# Patient Record
Sex: Female | Born: 1980
Health system: Southern US, Community
[De-identification: ages and names within clinical notes are randomized; demographics above are authoritative.]

## PROBLEM LIST (undated history)

## (undated) DIAGNOSIS — D649 Anemia, unspecified: Secondary | ICD-10-CM

## (undated) DIAGNOSIS — G35 Multiple sclerosis: Secondary | ICD-10-CM

## (undated) DIAGNOSIS — I10 Essential (primary) hypertension: Secondary | ICD-10-CM

---

## 2019-05-08 ENCOUNTER — Emergency Department (HOSPITAL_COMMUNITY)
Admission: EM | Admit: 2019-05-08 | Discharge: 2019-05-08 | Disposition: A | Discharge status: Home / Self Care | Payer: Self-pay | Attending: Emergency Medicine | Admitting: Emergency Medicine

## 2019-05-08 ENCOUNTER — Other Ambulatory Visit: Payer: Self-pay

## 2019-05-08 ENCOUNTER — Encounter (HOSPITAL_COMMUNITY): Payer: Self-pay | Admitting: Emergency Medicine

## 2019-05-08 DIAGNOSIS — Z202 Contact with and (suspected) exposure to infections with a predominantly sexual mode of transmission: Secondary | ICD-10-CM | POA: Insufficient documentation

## 2019-05-08 DIAGNOSIS — I10 Essential (primary) hypertension: Secondary | ICD-10-CM | POA: Insufficient documentation

## 2019-05-08 DIAGNOSIS — F17210 Nicotine dependence, cigarettes, uncomplicated: Secondary | ICD-10-CM | POA: Insufficient documentation

## 2019-05-08 HISTORY — DX: Essential (primary) hypertension: I10

## 2019-05-08 LAB — I-STAT BETA HCG BLOOD, ED (MC, WL, AP ONLY): I-stat hCG, quantitative: <5 m[IU]/mL (ref <5)

## 2019-05-08 MED ORDER — LIDOCAINE HCL (PF) 1 % IJ SOLN
INTRAMUSCULAR | Status: AC
  Administered 2019-05-08: 21:00:00 2 mL
  Filled 2019-05-08: qty 5

## 2019-05-08 MED ORDER — AZITHROMYCIN 1 G PO PACK
1.0000 g | PACK | Freq: Once | ORAL | Status: AC
  Administered 2019-05-08: 21:00:00 1 g via ORAL
  Filled 2019-05-08: qty 1

## 2019-05-08 MED ORDER — CEFTRIAXONE SODIUM 250 MG IJ SOLR
250.0000 mg | Freq: Once | INTRAMUSCULAR | Status: AC
  Administered 2019-05-08: 250 mg via INTRAMUSCULAR
  Filled 2019-05-08: qty 250

## 2019-05-08 MED ORDER — HYDROCHLOROTHIAZIDE 12.5 MG PO TABS: 12.5000 mg | ORAL_TABLET | Freq: Every day | ORAL | 0 refills | Status: DC

## 2019-05-08 NOTE — ED Notes (Signed)
Updated on wait for treatment room. 

## 2019-05-08 NOTE — ED Provider Notes (Addendum)
[facility] EMERGENCY DEPARTMENT Provider Note   CSN: [id number] Arrival date & time: 05/08/19  1648    History   Chief Complaint Chief Complaint  Patient presents with  . SEXUALLY TRANSMITTED DISEASE    HPI Diana Hess is a 38 y.o. female.     HPI  38 year old female presents with concern for STI testing.  She states that a female who recently had intercourse with her boyfriend told her that she tested positive for chlamydia.  The patient now wants to be tested though she has no symptoms.  No dysuria, abdominal pain, discharge, etc. Wants a pregnancy test.  Past Medical History:  Diagnosis Date  . Hypertension     There are no active problems to display for this patient.   Past Surgical History:  Procedure Laterality Date  . CESAREAN SECTION       OB History   No obstetric history on file.      Home Medications    Prior to Admission medications   Medication Sig Start Date End Date Taking? Authorizing Provider  hydrochlorothiazide (HYDRODIURIL) 12.5 MG tablet Take 1 tablet (12.5 mg total) by mouth daily. 05/08/19   Pricilla Loveless, MD    Family History No family history on file.  Social History Social History   Tobacco Use  . Smoking status: Current Every Day Smoker  . Smokeless tobacco: Never Used  Substance Use Topics  . Alcohol use: Yes  . Drug use: Never     Allergies   Patient has no allergy information on record.   Review of Systems Review of Systems  Gastrointestinal: Negative for abdominal pain.  Genitourinary: Negative for dysuria, vaginal bleeding and vaginal discharge.  All other systems reviewed and are negative.    Physical Exam Updated Vital Signs BP (!) 145/88 (BP Location: Right Arm)   Pulse 94   Temp 99.1 F (37.3 C) (Oral)   Resp 18   Ht 5\' 7"  (1.702 m)   Wt 108.9 kg   LMP 04/21/2019 (Approximate)   SpO2 100%   BMI 37.59 kg/m   Physical Exam Vitals signs and nursing note reviewed.   Constitutional:      Appearance: She is well-developed.  HENT:     Head: Normocephalic and atraumatic.     Right Ear: External ear normal.     Left Ear: External ear normal.     Nose: Nose normal.  Eyes:     General:        Right eye: No discharge.        Left eye: No discharge.  Cardiovascular:     Rate and Rhythm: Normal rate and regular rhythm.     Heart sounds: Normal heart sounds.  Pulmonary:     Effort: Pulmonary effort is normal.     Breath sounds: Normal breath sounds.  Abdominal:     General: There is no distension.     Palpations: Abdomen is soft.     Tenderness: There is no abdominal tenderness.  Skin:    General: Skin is warm and dry.  Neurological:     Mental Status: She is alert.  Psychiatric:        Mood and Affect: Mood is not anxious.      ED Treatments / Results  Labs (all labs ordered are listed, but only abnormal results are displayed) Labs Reviewed  RPR  HIV ANTIBODY (ROUTINE TESTING W REFLEX)  I-STAT BETA HCG BLOOD, ED ([facility], [facility], [facility] ONLY)  GC/CHLAMYDIA PROBE AMP ([facility]) NOT AT [facility]    EKG None  Radiology No results found.  Procedures Procedures (including critical care time)  Medications Ordered in ED Medications  cefTRIAXone (ROCEPHIN) injection 250 mg (has no administration in time range)  azithromycin (ZITHROMAX) powder 1 g (has no administration in time range)     Initial Impression / Assessment and Plan / ED Course  I have reviewed the triage vital signs and the nursing notes.  Pertinent labs & imaging results that were available during my care of the patient were reviewed by me and considered in my medical decision making (see chart for details).        Patient essentially wants screening for STI.  Given she is not symptomatic I will have her do urine GC/chlamydia and we will get HIV and RPR.  She is not pregnant.  I do not see an indication for a pelvic exam at this time.  She is wanting prophylactic treatment given  delay in test results, so I will give her IM Rocephin and p.o. azithromycin.  Patient also asked for blood pressure management.  She states her blood pressure has been high for a long time and recently she moved to this area.  She does not know what it normally runs but would like to be started on medicine.  I discussed the importance of following up with her PCP and will start her on low-dose HCTZ.  We discussed return precautions.  Final Clinical Impressions(s) / ED Diagnoses   Final diagnoses:  STD exposure  Essential hypertension    ED Discharge Orders         Ordered    hydrochlorothiazide (HYDRODIURIL) 12.5 MG tablet  Daily     05/08/19 2109           Sherwood Gambler, MD 05/08/19 2100    Sherwood Gambler, MD 05/08/19 2110

## 2019-05-08 NOTE — ED Triage Notes (Addendum)
Pt st's she needs to be checked for STD's  Pt denies any symptoms  Pt also requesting preg test

## 2019-05-09 LAB — HIV ANTIBODY (ROUTINE TESTING W REFLEX): HIV Screen 4th Generation wRfx: NONREACTIVE

## 2019-05-09 LAB — RPR: RPR Ser Ql: NONREACTIVE

## 2019-08-31 ENCOUNTER — Ambulatory Visit (HOSPITAL_COMMUNITY)
Admission: EM | Admit: 2019-08-31 | Discharge: 2019-08-31 | Disposition: A | Discharge status: Home / Self Care | Payer: Self-pay | Attending: Family Medicine | Admitting: Family Medicine

## 2019-08-31 ENCOUNTER — Encounter (HOSPITAL_COMMUNITY): Payer: Self-pay

## 2019-08-31 ENCOUNTER — Other Ambulatory Visit: Payer: Self-pay

## 2019-08-31 DIAGNOSIS — G35 Multiple sclerosis: Secondary | ICD-10-CM

## 2019-08-31 DIAGNOSIS — I10 Essential (primary) hypertension: Secondary | ICD-10-CM

## 2019-08-31 HISTORY — DX: Multiple sclerosis: G35

## 2019-08-31 MED ORDER — AMLODIPINE BESYLATE 10 MG PO TABS: 10.0000 mg | ORAL_TABLET | Freq: Every day | ORAL | 2 refills | Status: DC

## 2019-08-31 MED ORDER — METHYLPREDNISOLONE SODIUM SUCC 125 MG IJ SOLR
INTRAMUSCULAR | Status: AC
  Filled 2019-08-31: qty 2

## 2019-08-31 MED ORDER — METHYLPREDNISOLONE SODIUM SUCC 125 MG IJ SOLR
125.0000 mg | Freq: Once | INTRAMUSCULAR | Status: AC
  Administered 2019-08-31: 125 mg via INTRAMUSCULAR

## 2019-08-31 MED ORDER — ATENOLOL 25 MG PO TABS
25.0000 mg | ORAL_TABLET | Freq: Once | ORAL | Status: AC
  Administered 2019-08-31: 25 mg via ORAL

## 2019-08-31 MED ORDER — ATENOLOL 25 MG PO TABS
ORAL_TABLET | ORAL | Status: AC
  Filled 2019-08-31: qty 1

## 2019-08-31 MED ORDER — PREDNISONE 10 MG (48) PO TBPK: ORAL_TABLET | ORAL | 0 refills | Status: DC

## 2019-08-31 NOTE — ED Triage Notes (Signed)
Pt presents with bilateral hand numbness that has been going on for 3 weeks. Patient feels like her extremities are heavy. Reports she has MS and has had these issues in the past. Reports last time it was much worse so she wanted to come in before it got that bad. No neuro deficits present during triage.

## 2019-09-01 NOTE — ED Provider Notes (Signed)
[facility]   [id number] 08/31/19 Arrival Time: 8413  ASSESSMENT & PLAN:  1. Multiple sclerosis ([place])   2. Uncontrolled hypertension     Normal neurologic exam. No suspicion for ICH or SAH. No indication for neurodiagnostic imaging at this time. Discussed.  Meds ordered this encounter  Medications  . amLODipine (NORVASC) 10 MG tablet    Sig: Take 1 tablet (10 mg total) by mouth daily.    Dispense:  30 tablet    Refill:  2  . predniSONE (STERAPRED UNI-PAK 48 TAB) 10 MG (48) TBPK tablet    Sig: Take as directed.    Dispense:  48 tablet    Refill:  0      Given here:  . methylPREDNISolone sodium succinate (SOLU-MEDROL) 125 mg/2 mL injection 125 mg  . atenolol (TENORMIN) tablet 25 mg   "Just give me something to get my blood pressure down a little". Does not desire to stay for BP recheck. No s/s of hypertensive urgency.  Reassured that these symptoms do not appear to represent a serious or threatening condition. This is generally a self-limited temporary but uncomfortable situation. Rest, avoid potentially dangerous activities (such as driving or working with machinery or at heights). Use OTC Meclizine prn. Will proceed to the ED if he develops other symptoms such as alterations of speech, swallowing, vision, motor/sensory systems, or if dizziness worsens.  Reviewed expectations re: course of current medical issues. Questions answered. Outlined signs and symptoms indicating need for more acute intervention. Patient verbalized understanding. After Visit Summary given.   SUBJECTIVE:  Diana Hess is a 38 y.o. female with MS who reports gradual onset of bilateral sensation of "my hands feeling numb". First noted approx 3 weeks ago; waxes/wanes. Reports MS flares in the past with similar symptoms. "Just want to catch it before it gets worse." No dizziness/vertigo/lightheadedness reported. Experiencing symptoms frequently/daily with episodes varying in duration.  Aggravating factors: none identified. Alleviating factors: none identified. Denies coordination problems, gait problems, headaches, memory problems, seizures, speech problems, tremors and weakness as well as otalgia and tinnitus. Recent infections: none. Head trauma: denied. Noise exposure: none. No associated SOB, CP, or palpatations reported. Recent travel: none. Reports normal bowel/bladder habits. Therapies tried thus far: none. No new medications. Ambulatory without difficulty.  Social History   Substance and Sexual Activity  Alcohol Use Yes   Social History   Tobacco Use  Smoking Status Current Every Day Smoker  Smokeless Tobacco Never Used   Denies illegal drug use.   Increased blood pressure noted today. Reports that she has been treated for hypertension in the past. "Have run out of my medicine"; she thinks this is the HCTZ. "But I don't think it was working and it made me pee a lot; I don't want to take it anymore." Does desire to be placed on a different BP medication.  She reports no chest pain on exertion, no dyspnea on exertion, no swelling of ankles, no orthopnea or paroxysmal nocturnal dyspnea, no palpitations and no intermittent claudication symptoms.  ROS: As per HPI. All other systems negative.    OBJECTIVE:  Vitals:   08/31/19 1701 08/31/19 1729  BP: (!) 212/101 (!) 182/118  Pulse: 87   Resp: 18   Temp: 98.7 F (37.1 C)   SpO2: 98%     Elevated blood pressures noted.  General appearance: alert; no distress Eyes: PERRLA; EOMI; conjunctiva normal HENT: normocephalic; atraumatic; TMs normal; nasal mucosa normal; oral mucosa normal Neck: supple with FROM Lungs: clear to  auscultation bilaterally Heart: regular rate and rhythm Abdomen: soft, non-tender; bowel sounds normal Extremities: moves all extremities normally; no edema; symmetrical with no gross deformities Skin: warm and dry Neurologic: normal gait; DTR's normal and symmetric; normal grip strength  bilaterally Psychological: alert and cooperative; normal mood and affect   No Known Allergies  Past Medical History:  Diagnosis Date  . Hypertension   . Multiple sclerosis (H[facility])    Social History   Socioeconomic History  . Marital status: Single    Spouse name: Not on file  . Number of children: Not on file  . Years of education: Not on file  . Highest education level: Not on file  Occupational History  . Not on file  Tobacco Use  . Smoking status: Current Every Day Smoker  . Smokeless tobacco: Never Used  Substance and Sexual Activity  . Alcohol use: Yes  . Drug use: Never  . Sexual activity: Not on file  Other Topics Concern  . Not on file  Social History Narrative  . Not on file   Social Determinants of Health   Financial Resource Strain:   . Difficulty of Paying Living Expenses: Not on file  Food Insecurity:   . Worried About Programme researcher, broadcasting/film/video in the Last Year: Not on file  . Ran Out of Food in the Last Year: Not on file  Transportation Needs:   . Lack of Transportation (Medical): Not on file  . Lack of Transportation (Non-Medical): Not on file  Physical Activity:   . Days of Exercise per Week: Not on file  . Minutes of Exercise per Session: Not on file  Stress:   . Feeling of Stress : Not on file  Social Connections:   . Frequency of Communication with Friends and Family: Not on file  . Frequency of Social Gatherings with Friends and Family: Not on file  . Attends Religious Services: Not on file  . Active Member of Clubs or Organizations: Not on file  . Attends Banker Meetings: Not on file  . Marital Status: Not on file  Intimate Partner Violence:   . Fear of Current or Ex-Partner: Not on file  . Emotionally Abused: Not on file  . Physically Abused: Not on file  . Sexually Abused: Not on file   Family History  Problem Relation Age of Onset  . Healthy Mother   . Healthy Father   Question of HTN.  Past Surgical History:  Procedure  Laterality Date  . CESAREAN SECTION        Mardella Layman, MD 09/01/19 [phone]

## 2020-02-10 ENCOUNTER — Ambulatory Visit
Admission: EM | Admit: 2020-02-10 | Discharge: 2020-02-10 | Disposition: A | Discharge status: Home / Self Care | Payer: Self-pay | Attending: Emergency Medicine | Admitting: Emergency Medicine

## 2020-02-10 DIAGNOSIS — I10 Essential (primary) hypertension: Secondary | ICD-10-CM

## 2020-02-10 DIAGNOSIS — Z760 Encounter for issue of repeat prescription: Secondary | ICD-10-CM

## 2020-02-10 MED ORDER — AMLODIPINE BESYLATE 10 MG PO TABS: 10.0000 mg | ORAL_TABLET | Freq: Every day | ORAL | 1 refills | Status: DC

## 2020-02-10 NOTE — ED Triage Notes (Signed)
Pt states moved here recently and can't get in to a new PCP for another 2 months. Pt states out of her b/p meds for 3wks and having headaches off and on for the past 2wks. No distress noted.

## 2020-02-10 NOTE — Discharge Instructions (Addendum)
Very important to take blood pressure medication at the same time every day. You have been given 1 month supply with 1 refill. To receive further refills/medication, you will need to see your primary care doctor. Please keep July appointment.

## 2020-02-10 NOTE — ED Provider Notes (Signed)
[facility]    CSN: [id number] Arrival date & time: 02/10/20  1616      History   Chief Complaint Chief Complaint  Patient presents with  . Hypertension    HPI Diana Hess is a 39 y.o. female with history of hypertension, MS presenting for medication refill.  States that she moved here recently and was unable to get a primary care appointment for medication refills until July.  States she has tolerated amlodipine 10 mg well in the past: Has been off of her meds for 3 weeks.  Has noted intermittent headaches for the last 2 weeks.  Denies change in vision, hearing, dizziness, weakness, chest pain, difficulty breathing, abdominal pain.    Past Medical History:  Diagnosis Date  . Hypertension   . Multiple sclerosis ([place])     There are no problems to display for this patient.   Past Surgical History:  Procedure Laterality Date  . CESAREAN SECTION      OB History   No obstetric history on file.      Home Medications    Prior to Admission medications   Medication Sig Start Date End Date Taking? Authorizing Provider  amLODipine (NORVASC) 10 MG tablet Take 1 tablet (10 mg total) by mouth daily. 02/10/20   Hall-Potvin, [place], PA-C    Family History Family History  Problem Relation Age of Onset  . Healthy Mother   . Healthy Father     Social History Social History   Tobacco Use  . Smoking status: Current Every Day Smoker  . Smokeless tobacco: Never Used  Substance Use Topics  . Alcohol use: Yes  . Drug use: Never     Allergies   Patient has no known allergies.   Review of Systems As per HPI   Physical Exam Triage Vital Signs ED Triage Vitals  Enc Vitals Group     BP      Pulse      Resp      Temp      Temp src      SpO2      Weight      Height      Head Circumference      Peak Flow      Pain Score      Pain Loc      Pain Edu?      Excl. in [place]?    No data found.  Updated Vital Signs BP (!) 148/94 (BP Location:  Left Arm)   Pulse 97   Temp 98.6 F (37 C) (Oral)   Resp 16   LMP 01/04/2020   SpO2 97%   Visual Acuity Right Eye Distance:   Left Eye Distance:   Bilateral Distance:    Right Eye Near:   Left Eye Near:    Bilateral Near:     Physical Exam Constitutional:      General: She is not in acute distress. HENT:     Head: Normocephalic and atraumatic.  Eyes:     General: No scleral icterus.    Pupils: Pupils are equal, round, and reactive to light.  Cardiovascular:     Rate and Rhythm: Normal rate.  Pulmonary:     Effort: Pulmonary effort is normal.  Skin:    Coloration: Skin is not jaundiced or pale.  Neurological:     Mental Status: She is alert and oriented to person, place, and time.      UC Treatments / Results  Labs (all labs  ordered are listed, but only abnormal results are displayed) Labs Reviewed - No data to display  EKG   Radiology No results found.  Procedures Procedures (including critical care time)  Medications Ordered in UC Medications - No data to display  Initial Impression / Assessment and Plan / UC Course  I have reviewed the triage vital signs and the nursing notes.  Pertinent labs & imaging results that were available during my care of the patient were reviewed by me and considered in my medical decision making (see chart for details).     Patient afebrile, nontoxic in office today.  Patient's blood pressure is elevated, though she is denying signs/symptoms of endorgan damage.  Chart review does not show that patient has PCP appointment in July: This provider coordinated care-patient has PCP appointment scheduled for 7/8.  Relayed appointment date/time to patient who verbalized understanding: Intends to keep.  Will send in 30-day supply of amlodipine, 1 refill so patient has enough medication to get her through to appointment.  Return precautions discussed, patient verbalized understanding and is agreeable to plan. Final Clinical  Impressions(s) / UC Diagnoses   Final diagnoses:  Essential hypertension  Encounter for medication refill     Discharge Instructions     Very important to take blood pressure medication at the same time every day. You have been given 1 month supply with 1 refill. To receive further refills/medication, you will need to see your primary care doctor. Please keep July appointment.     ED Prescriptions    Medication Sig Dispense Auth. Provider   amLODipine (NORVASC) 10 MG tablet Take 1 tablet (10 mg total) by mouth daily. 30 tablet Hall-Potvin, [place], PA-C     PDMP not reviewed this encounter.   Hall-Potvin, [place], [place] 02/11/20 [phone]

## 2020-02-11 ENCOUNTER — Encounter: Payer: Self-pay | Admitting: Emergency Medicine

## 2020-03-28 ENCOUNTER — Telehealth: Payer: Self-pay

## 2020-03-28 NOTE — Telephone Encounter (Signed)
Called patient to do their pre-visit COVID screening.  Call went to voicemail. Unable to do prescreening.  

## 2020-03-28 NOTE — Patient Instructions (Signed)
Thank you for choosing Primary Care at [facility] to be your medical home!    Diana Hess was seen by De Hollingshead, DO today.   [name] primary care provider is Marcy Siren, DO.   For the best care possible, you should try to see Marcy Siren, DO whenever you come to the clinic.   We look forward to seeing you again soon!  If you have any questions about your visit today, please call us at [phone] or feel free to reach your primary care provider via MyChart.

## 2020-03-29 ENCOUNTER — Other Ambulatory Visit: Payer: Self-pay

## 2020-03-29 ENCOUNTER — Encounter: Payer: Self-pay | Admitting: Internal Medicine

## 2020-03-29 ENCOUNTER — Ambulatory Visit (INDEPENDENT_AMBULATORY_CARE_PROVIDER_SITE_OTHER): Payer: Medicaid - Out of State | Admitting: Internal Medicine

## 2020-03-29 VITALS — BP 129/85 | HR 86 | Temp 97.2°F | Resp 17 | Ht 68.0 in | Wt 236.0 lb

## 2020-03-29 DIAGNOSIS — G35 Multiple sclerosis: Secondary | ICD-10-CM

## 2020-03-29 DIAGNOSIS — R5383 Other fatigue: Secondary | ICD-10-CM

## 2020-03-29 DIAGNOSIS — Z7689 Persons encountering health services in other specified circumstances: Secondary | ICD-10-CM

## 2020-03-29 DIAGNOSIS — I10 Essential (primary) hypertension: Secondary | ICD-10-CM

## 2020-03-29 DIAGNOSIS — Z1159 Encounter for screening for other viral diseases: Secondary | ICD-10-CM | POA: Diagnosis not present

## 2020-03-29 DIAGNOSIS — E669 Obesity, unspecified: Secondary | ICD-10-CM

## 2020-03-29 MED ORDER — AMLODIPINE BESYLATE 10 MG PO TABS: 10.0000 mg | ORAL_TABLET | Freq: Every day | ORAL | 1 refills | Status: DC

## 2020-03-29 NOTE — Progress Notes (Signed)
Subjective:    Diana Hess - 39 y.o. female MRN [id number]  Date of birth: 09-18-1981  HPI  Diana Hess is to establish care. Patient has a PMH significant for HTN and MS.   Chronic HTN Reports was on Lisinopril in past but caused a cough.  Disease Monitoring:  Home BP Monitoring - Does not monitor.  Chest pain- no  Dyspnea- no Headache - no  Medications: Amlodipine 10 mg  Compliance- yes Lightheadedness- no  Edema- no    Reports concerns about low energy for the past 6 months. Diana Hess says Diana Hess is oversleeping, sleeping about 10+ hours per night. Does not feel well rested in the AM. No AM headaches. No nighttime awakenings. Diana Hess does snore. Her significant other says Diana Hess stops breathing in her sleep at night. Diana Hess is taking some type of vitamins for energy, thinks its is a B complex.    ROS per HPI     Health Maintenance:  Health Maintenance Due  Topic Date Due  . Hepatitis C Screening  Never done  . COVID-19 Vaccine (1) Never done  . TETANUS/TDAP  Never done  . PAP SMEAR-Modifier  Never done     Past Medical History: Patient Active Problem List   Diagnosis Date Noted  . Essential hypertension 03/29/2020   Social History   reports that Diana Hess has been smoking. Diana Hess has never used smokeless tobacco. Diana Hess reports current alcohol use. Diana Hess reports that Diana Hess does not use drugs.   Family History  family history includes Healthy in her father and mother.   Medications: reviewed and updated   Objective:   Physical Exam BP 129/85   Pulse 86   Temp (!) 97.2 F (36.2 C) (Temporal)   Resp 17   Ht 5\' 8"  (1.727 m)   Wt 236 lb (107 kg)   SpO2 96%   BMI 35.88 kg/m  Physical Exam Constitutional:      General: Diana Hess is not in acute distress.    Appearance: Diana Hess is not diaphoretic.  Cardiovascular:     Rate and Rhythm: Normal rate.  Pulmonary:     Effort: Pulmonary effort is normal. No respiratory distress.  Musculoskeletal:        General: Normal range  of motion.  Skin:    General: Skin is warm and dry.  Neurological:     Mental Status: Diana Hess is alert and oriented to person, place, and time.  Psychiatric:        Mood and Affect: Affect normal.        Judgment: Judgment normal.         Assessment & Plan:   1. Encounter to establish care Reviewed patient's PMH, social history, surgical history, and medications.   2. Essential hypertension BP at goal. Continue current regimen.  - CBC with Differential - Comprehensive metabolic panel - amLODipine (NORVASC) 10 MG tablet; Take 1 tablet (10 mg total) by mouth daily.  Dispense: 30 tablet; Refill: 1  3. Need for hepatitis C screening test - Hepatitis C Antibody  4. Fatigue, unspecified type Will order screening labs to investigate possible etiologies. Sleep study as below.  - CBC with Differential - Comprehensive metabolic panel - Vitamin B12 - VITAMIN D 25 Hydroxy (Vit-D Deficiency, Fractures) - TSH - Split night study; Future  5. Obesity (BMI 30-39.9) Symptoms of snoring, fatigue, witnessed apneic episodes along with obesity is concerning for OSA. Sleep study ordered.  - Split night study; Future  6. Multiple sclerosis (HCC) Referral placed to establish care.  -  Ambulatory referral to Neurology       Marcy Siren, D.O. 03/29/2020, 3:13 PM Primary Care at [facility]

## 2020-03-30 LAB — CBC WITH DIFFERENTIAL/PLATELET
Basophils Absolute: 0.1 10*3/uL (ref 0.0–0.2)
Basos: 1 %
EOS (ABSOLUTE): 0.7 10*3/uL — ABNORMAL HIGH (ref 0.0–0.4)
Eos: 9 %
Hematocrit: 33.9 % — ABNORMAL LOW (ref 34.0–46.6)
Hemoglobin: 10.1 g/dL — ABNORMAL LOW (ref 11.1–15.9)
Immature Grans (Abs): 0.1 10*3/uL (ref 0.0–0.1)
Immature Granulocytes: 1 %
Lymphocytes Absolute: 2.1 10*3/uL (ref 0.7–3.1)
Lymphs: 27 %
MCH: 23.4 pg — ABNORMAL LOW (ref 26.6–33.0)
MCHC: 29.8 g/dL — ABNORMAL LOW (ref 31.5–35.7)
MCV: 79 fL (ref 79–97)
Monocytes Absolute: 0.6 10*3/uL (ref 0.1–0.9)
Monocytes: 8 %
Neutrophils Absolute: 4.2 10*3/uL (ref 1.4–7.0)
Neutrophils: 54 %
Platelets: 474 10*3/uL — ABNORMAL HIGH (ref 150–450)
RBC: 4.31 x10E6/uL (ref 3.77–5.28)
RDW: 16.2 % — ABNORMAL HIGH (ref 11.7–15.4)
WBC: 7.7 10*3/uL (ref 3.4–10.8)

## 2020-03-30 LAB — VITAMIN D 25 HYDROXY (VIT D DEFICIENCY, FRACTURES): Vit D, 25-Hydroxy: 8.3 ng/mL — ABNORMAL LOW (ref 30.0–100.0)

## 2020-03-30 LAB — COMPREHENSIVE METABOLIC PANEL
ALT: 10 IU/L (ref 0–32)
AST: 13 IU/L (ref 0–40)
Albumin/Globulin Ratio: 1.4 (ref 1.2–2.2)
Albumin: 4.3 g/dL (ref 3.8–4.8)
Alkaline Phosphatase: 74 IU/L (ref 48–121)
BUN/Creatinine Ratio: 11 (ref 9–23)
BUN: 10 mg/dL (ref 6–20)
Bilirubin Total: <0.2 mg/dL (ref 0.0–1.2)
CO2: 22 mmol/L (ref 20–29)
Calcium: 9.1 mg/dL (ref 8.7–10.2)
Chloride: 103 mmol/L (ref 96–106)
Creatinine, Ser: 0.88 mg/dL (ref 0.57–1.00)
GFR calc Af Amer: 96 mL/min/{1.73_m2} (ref >59)
GFR calc non Af Amer: 83 mL/min/{1.73_m2} (ref >59)
Globulin, Total: 3.1 g/dL (ref 1.5–4.5)
Glucose: 97 mg/dL (ref 65–99)
Potassium: 4.3 mmol/L (ref 3.5–5.2)
Sodium: 137 mmol/L (ref 134–144)
Total Protein: 7.4 g/dL (ref 6.0–8.5)

## 2020-03-30 LAB — TSH: TSH: 0.711 u[IU]/mL (ref 0.450–4.500)

## 2020-03-30 LAB — VITAMIN B12: Vitamin B-12: 945 pg/mL (ref 232–1245)

## 2020-03-30 LAB — HEPATITIS C ANTIBODY: Hep C Virus Ab: <0.1 s/co ratio (ref 0.0–0.9)

## 2020-04-02 ENCOUNTER — Other Ambulatory Visit: Payer: Self-pay | Admitting: Internal Medicine

## 2020-04-02 ENCOUNTER — Telehealth: Payer: Self-pay | Admitting: Internal Medicine

## 2020-04-02 DIAGNOSIS — E559 Vitamin D deficiency, unspecified: Secondary | ICD-10-CM | POA: Insufficient documentation

## 2020-04-02 DIAGNOSIS — D649 Anemia, unspecified: Secondary | ICD-10-CM

## 2020-04-02 MED ORDER — FERROUS SULFATE 324 (65 FE) MG PO TBEC: 1.0000 | DELAYED_RELEASE_TABLET | ORAL | 0 refills | Status: DC

## 2020-04-02 MED ORDER — VITAMIN D (ERGOCALCIFEROL) 1.25 MG (50000 UNIT) PO CAPS: 50000.0000 [IU] | ORAL_CAPSULE | ORAL | 0 refills | Status: DC

## 2020-04-02 NOTE — Telephone Encounter (Signed)
Patient called regarding her lab results. Patient has some questions regarding medication that was prescribed. Please f/u

## 2020-04-02 NOTE — Telephone Encounter (Signed)
See result note.  

## 2020-04-27 ENCOUNTER — Other Ambulatory Visit: Payer: Self-pay

## 2020-04-27 ENCOUNTER — Ambulatory Visit (HOSPITAL_BASED_OUTPATIENT_CLINIC_OR_DEPARTMENT_OTHER): Payer: Medicaid - Out of State | Attending: Internal Medicine | Admitting: Internal Medicine

## 2020-04-27 DIAGNOSIS — R5383 Other fatigue: Secondary | ICD-10-CM | POA: Insufficient documentation

## 2020-04-27 DIAGNOSIS — Z6830 Body mass index (BMI) 30.0-30.9, adult: Secondary | ICD-10-CM | POA: Diagnosis not present

## 2020-04-27 DIAGNOSIS — E669 Obesity, unspecified: Secondary | ICD-10-CM | POA: Diagnosis not present

## 2020-05-02 ENCOUNTER — Telehealth: Payer: Self-pay | Admitting: Internal Medicine

## 2020-05-02 NOTE — Telephone Encounter (Signed)
You can call the sleep center to reschedule. [phone]

## 2020-05-02 NOTE — Telephone Encounter (Signed)
Pt called stating they needed the sleep study rescheduled due to being sick last time it was made

## 2020-05-03 NOTE — Telephone Encounter (Signed)
Called the pt because the sleep center wants her to call them.

## 2020-05-07 ENCOUNTER — Encounter (HOSPITAL_BASED_OUTPATIENT_CLINIC_OR_DEPARTMENT_OTHER): Payer: Self-pay

## 2020-05-07 DIAGNOSIS — R0683 Snoring: Secondary | ICD-10-CM

## 2020-05-27 ENCOUNTER — Ambulatory Visit (HOSPITAL_BASED_OUTPATIENT_CLINIC_OR_DEPARTMENT_OTHER): Payer: Medicaid - Out of State | Attending: Internal Medicine | Admitting: Internal Medicine

## 2020-05-31 ENCOUNTER — Other Ambulatory Visit: Payer: Self-pay

## 2020-05-31 DIAGNOSIS — I10 Essential (primary) hypertension: Secondary | ICD-10-CM

## 2020-05-31 MED ORDER — AMLODIPINE BESYLATE 10 MG PO TABS: 10.0000 mg | ORAL_TABLET | Freq: Every day | ORAL | 1 refills | Status: DC

## 2020-08-23 ENCOUNTER — Other Ambulatory Visit: Payer: Self-pay

## 2020-08-23 ENCOUNTER — Other Ambulatory Visit: Payer: Self-pay | Admitting: Physician Assistant

## 2020-08-23 ENCOUNTER — Ambulatory Visit: Payer: Medicaid - Out of State | Admitting: Physician Assistant

## 2020-08-23 ENCOUNTER — Ambulatory Visit (INDEPENDENT_AMBULATORY_CARE_PROVIDER_SITE_OTHER): Payer: Medicaid - Out of State

## 2020-08-23 VITALS — BP 111/75 | HR 91 | Temp 97.7°F | Resp 17 | Wt 229.0 lb

## 2020-08-23 DIAGNOSIS — S0033XA Contusion of nose, initial encounter: Secondary | ICD-10-CM | POA: Diagnosis not present

## 2020-08-23 DIAGNOSIS — S0993XA Unspecified injury of face, initial encounter: Secondary | ICD-10-CM

## 2020-08-23 DIAGNOSIS — K047 Periapical abscess without sinus: Secondary | ICD-10-CM

## 2020-08-23 DIAGNOSIS — I10 Essential (primary) hypertension: Secondary | ICD-10-CM | POA: Diagnosis not present

## 2020-08-23 MED ORDER — AMOXICILLIN 500 MG PO CAPS: 1000.0000 mg | ORAL_CAPSULE | Freq: Two times a day (BID) | ORAL | 0 refills | Status: DC

## 2020-08-23 MED ORDER — MUPIROCIN 2 % EX OINT: 1.0000 "application " | TOPICAL_OINTMENT | Freq: Two times a day (BID) | CUTANEOUS | 0 refills | Status: DC

## 2020-08-23 MED ORDER — FLUCONAZOLE 150 MG PO TABS: 150.0000 mg | ORAL_TABLET | Freq: Once | ORAL | 0 refills | Status: DC

## 2020-08-23 MED FILL — MUPIROCIN 2% OINTMENT: 2 | 10 days supply | Qty: 22 | Fill #0

## 2020-08-23 MED FILL — FLUCONAZOLE 150 MG TABLET: 150 | 1 days supply | Qty: 1 | Fill #0

## 2020-08-23 MED FILL — AMOXICILLIN 500 MG CAPSULE: 500 | 10 days supply | Qty: 40 | Fill #0

## 2020-08-23 NOTE — Progress Notes (Signed)
Diana Hess, is a 39 y.o. female  MVH:[id number]  [id number]:[id number]  DOB - Apr 02, 1981  Subjective:  Chief Complaint and HPI: Diana Hess is a 39 y.o. female here today for contusion to face and nose about 2 weeks ago.  She had drank a bottle of wine and was climbing up her carpeted stair and tripped and fell forward then slid back down the stairs scraping her face.  She has been using neosporin.  She is worried about having to see a specialist due to cost but there is a sore under her R nare. She is congested.  There was no LOC.  She remembers everything.  She does feel like her front L tooth is loose and infected from the fall as well.     ROS:   Constitutional:  No f/c, No night sweats, No unexplained weight loss. EENT:  No vision changes, No blurry vision Respiratory: No cough, No SOB Cardiac: No CP, no palpitations GI:  No abd pain, No N/V/D. GU: No Urinary s/sx Musculoskeletal: No joint pain Neuro: No headache, no dizziness, no motor weakness.  Skin: No rash Endocrine:  No polydipsia. No polyuria.  Psych: Denies SI/HI  No problems updated.  ALLERGIES: Allergies  Allergen Reactions  . Lisinopril Cough    PAST MEDICAL HISTORY: Past Medical History:  Diagnosis Date  . Hypertension   . Multiple sclerosis (HCC)     MEDICATIONS AT HOME: Prior to Admission medications   Medication Sig Start Date End Date Taking? Authorizing Provider  amLODipine (NORVASC) 10 MG tablet Take 1 tablet (10 mg total) by mouth daily. 05/31/20  Yes Arvilla Market, DO  ferrous sulfate 324 (65 Fe) MG TBEC Take 1 tablet (325 mg total) by mouth every other day. 04/02/20  Yes Arvilla Market, DO  amoxicillin (AMOXIL) 500 MG capsule Take 2 capsules (1,000 mg total) by mouth 2 (two) times daily. 08/23/20   Anders Simmonds, PA-C  fluconazole (DIFLUCAN) 150 MG tablet Take 1 tablet (150 mg total) by mouth once for 1 dose. If needed after antibiotics for yeast infection 08/23/20  08/23/20  Anders Simmonds, PA-C  mupirocin ointment (BACTROBAN) 2 % Apply 1 application topically 2 (two) times daily. 08/23/20   Anders Simmonds, PA-C     Objective:  EXAM:   Vitals:   08/23/20 1509  BP: 111/75  Pulse: 91  Resp: 17  Temp: 97.7 F (36.5 C)  TempSrc: Temporal  SpO2: 97%  Weight: 229 lb (103.9 kg)    General appearance : A&OX3. NAD. Non-toxic-appearing HEENT: Atraumatic and Normocephalic.  PERRLA. EOM intact.  TM clear B.  1cm ulceration/skin deficit beneath R nare.  Turbinates are swollen.  Septum appears midline. Poor dentition, many caries and decayed teeth, front L tooth is slightly loose and is erythematous at the base.  No other visible abrasions. Mouth-MMM, post pharynx WNL w/o erythema, + PND. Neck: supple, no JVD. No cervical lymphadenopathy. No thyromegaly Chest/Lungs:  Breathing-non-labored, Good air entry bilaterally, breath sounds normal without rales, rhonchi, or wheezing  CVS: S1 S2 regular, no murmurs, gallops, rubs  Extremities: Bilateral Lower Ext shows no edema, both legs are warm to touch with = pulse throughout Neurology:  CN II-XII grossly intact, Non focal.  Facial symmetry is intact Psych:  TP linear. J/I WNL. Normal speech. Appropriate eye contact and affect.  Skin:  No Rash  Data Review No results found for: HGBA1C   Assessment & Plan   1. Essential hypertension Controlled-continue current regimen.  May  call for RF  2. Facial injury, initial encounter R/o fx-may need referral to ENT as well - DG Nasal Bones; Future  3. Tooth abscess - amoxicillin (AMOXIL) 500 MG capsule; Take 2 capsules (1,000 mg total) by mouth 2 (two) times daily.  Dispense: 40 capsule; Refill: 0 - fluconazole (DIFLUCAN) 150 MG tablet; Take 1 tablet (150 mg total) by mouth once for 1 dose. If needed after antibiotics for yeast infection  Dispense: 1 tablet; Refill: 0 - Ambulatory referral to Dentistry  4. Contusion of nose, initial encounter May require  plastic surgery but patient wants to see how it's going to heal first.  I am recommending she at least go for a consultaion and then decide - DG Nasal Bones; Future - amoxicillin (AMOXIL) 500 MG capsule; Take 2 capsules (1,000 mg total) by mouth 2 (two) times daily.  Dispense: 40 capsule; Refill: 0 - fluconazole (DIFLUCAN) 150 MG tablet; Take 1 tablet (150 mg total) by mouth once for 1 dose. If needed after antibiotics for yeast infection  Dispense: 1 tablet; Refill: 0 - mupirocin ointment (BACTROBAN) 2 %; Apply 1 application topically 2 (two) times daily.  Dispense: 22 g; Refill: 0 - Ambulatory referral to Plastic Surgery     Patient have been counseled extensively about nutrition and exercise  Return in about 3 months (around 11/21/2020) for pcp;  blood pressure/anemia.  The patient was given clear instructions to go to ER or return to medical center if symptoms don't improve, worsen or new problems develop. The patient verbalized understanding. The patient was told to call to get lab results if they haven't heard anything in the next week.     Georgian Co, PA-C [facility] [place], [place] [phone]   08/23/2020, 3:37 PMPatient ID: Diana Hess, female   DOB: 08-19-1981, 39 y.o.   MRN: [id number]

## 2020-08-27 ENCOUNTER — Other Ambulatory Visit: Payer: Self-pay | Admitting: Physician Assistant

## 2020-08-27 DIAGNOSIS — S022XXG Fracture of nasal bones, subsequent encounter for fracture with delayed healing: Secondary | ICD-10-CM

## 2020-08-27 NOTE — Progress Notes (Signed)
Patient notified of results & recommendations. Expressed understanding.

## 2020-10-01 ENCOUNTER — Telehealth: Payer: Medicaid - Out of State | Admitting: Family

## 2020-11-22 ENCOUNTER — Ambulatory Visit: Payer: Self-pay | Admitting: Internal Medicine

## 2020-12-03 ENCOUNTER — Ambulatory Visit: Payer: Self-pay | Admitting: Family

## 2020-12-05 ENCOUNTER — Encounter: Payer: Medicaid - Out of State | Admitting: Family

## 2020-12-05 ENCOUNTER — Ambulatory Visit: Payer: Self-pay | Admitting: Family

## 2020-12-05 NOTE — Progress Notes (Deleted)
Patient ID: Diana Hess, female    DOB: 1981-01-14  MRN: [id number]  CC: Hypertension Follow-Up  Subjective: Diana Hess is a 40 y.o. female who presents for hypertension follow-up. Her concerns today include: ***  1. HYPERTENSION FOLLOW-UP:  Currently taking: see medication list Have you taken your blood pressure medication today: []  Yes []  No  Med Adherence: []  Yes    []  No Medication side effects: []  Yes    []  No Adherence with salt restriction (low-salt diet): []  Yes    []  No Exercise: Yes []  No []  Home Monitoring?: []  Yes    []  No Monitoring Frequency: []  Yes    []  No Home BP results range: []  Yes    []  No Smoking []  Yes []  No SOB? []  Yes    []  No Chest Pain?: []  Yes    []  No Leg swelling?: []  Yes    []  No Headaches?: []  Yes    []  No Dizziness? []  Yes    []  No Comments:    Patient Active Problem List   Diagnosis Date Noted  . Vitamin D deficiency 04/02/2020  . Essential hypertension 03/29/2020     Current Outpatient Medications on File Prior to Visit  Medication Sig Dispense Refill  . amLODipine (NORVASC) 10 MG tablet Take 1 tablet (10 mg total) by mouth daily. 90 tablet 1  . amoxicillin (AMOXIL) 500 MG capsule Take 2 capsules (1,000 mg total) by mouth 2 (two) times daily. 40 capsule 0  . ferrous sulfate 324 (65 Fe) MG TBEC Take 1 tablet (325 mg total) by mouth every other day. 90 tablet 0  . mupirocin ointment (BACTROBAN) 2 % Apply 1 application topically 2 (two) times daily. 22 g 0   No current facility-administered medications on file prior to visit.    Allergies  Allergen Reactions  . Lisinopril Cough    Social History   Socioeconomic History  . Marital status: Single    Spouse name: Not on file  . Number of children: Not on file  . Years of education: Not on file  . Highest education level: Not on file  Occupational History  . Not on file  Tobacco Use  . Smoking status: Current Every Day Smoker  . Smokeless tobacco: Never Used   Substance and Sexual Activity  . Alcohol use: Yes  . Drug use: Never  . Sexual activity: Not on file  Other Topics Concern  . Not on file  Social History Narrative  . Not on file   Social Determinants of Health   Financial Resource Strain: Not on file  Food Insecurity: Not on file  Transportation Needs: Not on file  Physical Activity: Not on file  Stress: Not on file  Social Connections: Not on file  Intimate Partner Violence: Not on file    Family History  Problem Relation Age of Onset  . Healthy Mother   . Healthy Father     Past Surgical History:  Procedure Laterality Date  . CESAREAN SECTION    . CESAREAN SECTION N/A    Phreesia 03/26/2020    ROS: Review of Systems Negative except as stated above  PHYSICAL EXAM: There were no vitals taken for this visit.  Physical Exam  {female adult master:310786} {female adult master:310785}  CMP Latest Ref Rng & Units 03/29/2020  Glucose 65 - 99 mg/dL 97  BUN 6 - 20 mg/dL 10  Creatinine - mg/dL  Sodium - mmol/L 137  Potassium 3.5 -  5.2 mmol/L 4.3  Chloride 96 - 106 mmol/L 103  CO2 20 - 29 mmol/L 22  Calcium 8.7 - 10.2 mg/dL 9.1  Total Protein 6.0 - 8.5 g/dL 7.4  Total Bilirubin 0.0 - 1.2 mg/dL <8.5  Alkaline Phos 48 - 121 IU/L 74  AST 0 - 40 IU/L 13  ALT 0 - 32 IU/L 10   Lipid Panel  No results found for: CHOL, TRIG, HDL, CHOLHDL, VLDL, LDLCALC, LDLDIRECT  CBC    Component Value Date/Time   WBC 7.7 03/29/2020 1522   RBC 4.31 03/29/2020 1522   HGB 10.1 (L) 03/29/2020 1522   HCT 33.9 (L) 03/29/2020 1522   PLT 474 (H) 03/29/2020 1522   MCV 79 03/29/2020 1522   MCH 23.4 (L) 03/29/2020 1522   MCHC 29.8 (L) 03/29/2020 1522   RDW 16.2 (H) 03/29/2020 1522   LYMPHSABS 2.1 03/29/2020 1522   EOSABS 0.7 (H) 03/29/2020 1522   BASOSABS 0.1 03/29/2020 1522    ASSESSMENT AND PLAN:  There are no diagnoses linked to this encounter.   Patient was given the opportunity to ask questions.   Patient verbalized understanding of the plan and was able to repeat key elements of the plan.   No orders of the defined types were placed in this encounter.    Requested Prescriptions    No prescriptions requested or ordered in this encounter    No follow-ups on file.  Rema Fendt, NP

## 2020-12-05 NOTE — Progress Notes (Signed)
Patient did not show for appointment.   

## 2020-12-07 ENCOUNTER — Encounter: Payer: Self-pay | Admitting: Family

## 2020-12-07 NOTE — Progress Notes (Signed)
Patient did not show for appointment.   

## 2020-12-12 ENCOUNTER — Encounter: Payer: Self-pay | Admitting: Family

## 2020-12-12 ENCOUNTER — Ambulatory Visit (INDEPENDENT_AMBULATORY_CARE_PROVIDER_SITE_OTHER): Payer: Medicaid - Out of State | Admitting: Family

## 2020-12-12 ENCOUNTER — Other Ambulatory Visit: Payer: Self-pay

## 2020-12-12 VITALS — BP 133/86 | HR 84 | Wt 243.0 lb

## 2020-12-12 DIAGNOSIS — Z7689 Persons encountering health services in other specified circumstances: Secondary | ICD-10-CM | POA: Diagnosis not present

## 2020-12-12 DIAGNOSIS — D649 Anemia, unspecified: Secondary | ICD-10-CM

## 2020-12-12 DIAGNOSIS — I10 Essential (primary) hypertension: Secondary | ICD-10-CM | POA: Diagnosis not present

## 2020-12-12 DIAGNOSIS — G4730 Sleep apnea, unspecified: Secondary | ICD-10-CM

## 2020-12-12 DIAGNOSIS — D509 Iron deficiency anemia, unspecified: Secondary | ICD-10-CM | POA: Diagnosis not present

## 2020-12-12 NOTE — Patient Instructions (Addendum)
- Return for annual physical examination, labs, and health maintenance. Arrive fasting meaning having had no food and/or nothing to drink for at least 8 hours prior to appointment.  Please take scheduled medications as normal. - Continue Amlodipine for high blood pressure. Follow-up in 3 months or sooner of needed.  - Lab today for anemia checkup. - Referral for sleep study.  - Thank you for choosing Primary Care at [facility] for your medical home!    Diana Hess was seen by Camillia Herter, NP today.   Bethann Goo primary care provider is Tosh Glaze Zachery Dauer, NP.   For the best care possible,  you should try to see Durene Fruits, NP whenever you come to clinic.   We look forward to seeing you again soon!  If you have any questions about your visit today,  please call us at [phone]  Or feel free to reach your provider via [place].    Anemia  Anemia is a condition in which there is not enough red blood cells or hemoglobin in the blood. Hemoglobin is a substance in red blood cells that carries oxygen. When you do not have enough red blood cells or hemoglobin (are anemic), your body cannot get enough oxygen and your organs may not work properly. As a result, you may feel very tired or have other problems. What are the causes? Common causes of anemia include:  Excessive bleeding. Anemia can be caused by excessive bleeding inside or outside the body, including bleeding from the intestines or from heavy menstrual periods in females.  Poor nutrition.  Long-lasting (chronic) kidney, thyroid, and liver disease.  Bone marrow disorders, spleen problems, and blood disorders.  Cancer and treatments for cancer.  HIV (human immunodeficiency virus) and AIDS (acquired immunodeficiency syndrome).  Infections, medicines, and autoimmune disorders that destroy red blood cells. What are the signs or symptoms? Symptoms of this condition include:  Minor  weakness.  Dizziness.  Headache, or difficulties concentrating and sleeping.  Heartbeats that feel irregular or faster than normal (palpitations).  Shortness of breath, especially with exercise.  Pale skin, lips, and nails, or cold hands and feet.  Indigestion and nausea. Symptoms may occur suddenly or develop slowly. If your anemia is mild, you may not have symptoms. How is this diagnosed? This condition is diagnosed based on blood tests, your medical history, and a physical exam. In some cases, a test may be needed in which cells are removed from the soft tissue inside of a bone and looked at under a microscope (bone marrow biopsy). Your health care provider may also check your stool (feces) for blood and may do additional testing to look for the cause of your bleeding. Other tests may include:  Imaging tests, such as a CT scan or MRI.  A procedure to see inside your esophagus and stomach (endoscopy).  A procedure to see inside your colon and rectum (colonoscopy). How is this treated? Treatment for this condition depends on the cause. If you continue to lose a lot of blood, you may need to be treated at a hospital. Treatment may include:  Taking supplements of iron, vitamin W73, or folic acid.  Taking a hormone medicine (erythropoietin) that can help to stimulate red blood cell growth.  Having a blood transfusion. This may be needed if you lose a lot of blood.  Making changes to your diet.  Having surgery to remove your spleen. Follow these instructions at home:  Take over-the-counter and prescription medicines only as told by  your health care provider.  Take supplements only as told by your health care provider.  Follow any diet instructions that you were given by your health care provider.  Keep all follow-up visits as told by your health care provider. This is important. Contact a health care provider if:  You develop new bleeding anywhere in the body. Get help  right away if:  You are very weak.  You are short of breath.  You have pain in your abdomen or chest.  You are dizzy or feel faint.  You have trouble concentrating.  You have bloody stools, black stools, or tarry stools.  You vomit repeatedly or you vomit up blood. These symptoms may represent a serious problem that is an emergency. Do not wait to see if the symptoms will go away. Get medical help right away. Call your local emergency services (911 in the [place] Do not drive yourself to the hospital. Summary  Anemia is a condition in which you do not have enough red blood cells or enough of a substance in your red blood cells that carries oxygen (hemoglobin).  Symptoms may occur suddenly or develop slowly.  If your anemia is mild, you may not have symptoms.  This condition is diagnosed with blood tests, a medical history, and a physical exam. Other tests may be needed.  Treatment for this condition depends on the cause of the anemia. This information is not intended to replace advice given to you by your health care provider. Make sure you discuss any questions you have with your health care provider. Document Revised: 08/16/2019 Document Reviewed: 08/16/2019 Elsevier Patient Education  2021 [employer].

## 2020-12-12 NOTE — Progress Notes (Signed)
Amlodipine needs refill Pt wants iron levels check, due to fatigue, cold at times, night sweats

## 2020-12-12 NOTE — Progress Notes (Signed)
Subjective:    Diana Hess - 40 y.o. female MRN [id number]  Date of birth: [birth year]  HPI  Diana Hess is to establish care. Patient has a PMH significant for essential hypertension, vitamin D deficiency, and iron deficiency anemia.   Current issues and/or concerns: 1. HYPERTENSION FOLLOW-UP: 08/23/2020 per PA note: Controlled-continue current regimen.    12/12/2020: Currently taking: see medication list  Med Adherence: [x]  Yes    []  No Medication side effects: []  Yes    [x]  No Adherence with salt restriction (low-salt diet): [x]  Yes    []  No Exercise: Yes [x]  No []  Home Monitoring?: []  Yes    [x]  No Monitoring Frequency: []  Yes    [x]  No Home BP results range: []  Yes    [x]  No Smoking [x]  Yes, 1 pack cigarettes every 3 days  SOB? [x]  Yes    []  No Chest Pain?: []  Yes    [x]  No Leg swelling?: []  Yes    [x]  No Headaches?: [x]  Yes, BC Powder helps  Dizziness? []  Yes    [x]  No   2. ANEMIA FOLLOW-UP: Endorses history of anemia and taking prescribed iron pills at that time. Having fatigue. Would like to recheck anemia status today. Dyspnea on exertion: yes Palpitations: no Bleeding: no Pica: no   3. SLEEP APNEA SCREENING: STOP-BANG QUESTIONNAIRE  1. Snoring? Do you snore loudly (loud enough to be heard through closed doors, or your bed partner elbows you for snoring at night)? Yes [x]  No []  2. Tired? Do you often feel tired, fatigued, or sleepy during the daytime (such as falling asleep during driving)? Yes [x]  No []  3. Observed? Has anyone observed you stop breathing or choking/gasping during your sleep? Yes [x]  No []  4. Pressure? Do you have or are being treated for high blood pressure? Yes [x]  No []  5. Body mass index more than 35 kg/m2? Yes [x]  No []  6. Age older than 41 years old? Yes []  No [x]  7. Neck size large (measured around Adam's apple)? Is your shirt collar 16 inches or larger? Not sure 8. Gender (biologic sex) = Female? Yes []  No []    ROS per HPI     Health Maintenance:  Health Maintenance Due  Topic Date Due  . PAP SMEAR-Modifier  Never done    Past Medical History: Patient Active Problem List   Diagnosis Date Noted  . Iron deficiency anemia 12/13/2020  . Vitamin D deficiency 04/02/2020  . Essential hypertension 03/29/2020   Social History   reports that she has been smoking. She has never used smokeless tobacco. She reports current alcohol use. She reports that she does not use drugs.   Family History  family history includes Healthy in her father and mother.   Medications: reviewed and updated   Objective:   Physical Exam BP 133/86 (BP Location: Left Arm, Patient Position: Sitting)   Pulse 84   Wt 243 lb (110.2 kg)   SpO2 98%   BMI 38.06 kg/m  Physical Exam HENT:     Head: Normocephalic and atraumatic.  Eyes:     Extraocular Movements: Extraocular movements intact.     Conjunctiva/sclera: Conjunctivae normal.     Pupils: Pupils are equal, round, and reactive to light.  Cardiovascular:     Rate and Rhythm: Normal rate and regular rhythm.     Pulses: Normal pulses.     Heart sounds: Normal heart sounds.  Pulmonary:     Effort: Pulmonary effort is normal.     Breath sounds:  Normal breath sounds.  Musculoskeletal:     Cervical back: Normal range of motion and neck supple.  Neurological:     General: No focal deficit present.     Mental Status: She is alert.  Psychiatric:        Mood and Affect: Mood normal.        Behavior: Behavior normal.     Assessment & Plan:  1. Encounter to establish care: - Patient presents today to establish care.  - Return for annual physical examination, labs, and health maintenance. Arrive fasting meaning having had no food and/or nothing to drink for at least 8 hours prior to appointment.  Please take scheduled medications as normal.  2. Essential hypertension: - Blood pressure close to goal.  - Continue Amlodipine as prescribed.  - Counseled on blood pressure goal of  less than 130/80, low-sodium, DASH diet, medication compliance, 150 minutes of moderate intensity exercise per week as tolerated. Discussed medication compliance, adverse effects. - Follow-up with primary provider in 3 months or sooner if needed.  - amLODipine (NORVASC) 10 MG tablet; Take 1 tablet (10 mg total) by mouth daily.  Dispense: 120 tablet; Refill: 0  3. Anemia, unspecified type: 4. Iron deficiency anemia, unspecified iron deficiency anemia type: - History of anemia. Taking Ferrous Sulfate. - CBC and iron panel to check status of anemia. - CBC - Iron, TIBC and Ferritin Panel  5. Sleep apnea, unspecified type: - STOP-BANG QUESTIONNAIRE indicates high risk of sleep apnea. - Referral for Sleep Study for further evaluation.  - PSG Sleep Study; Future    Patient was given clear instructions to go to Emergency Department or return to medical center if symptoms don't improve, worsen, or new problems develop.The patient verbalized understanding.  I discussed the assessment and treatment plan with the patient. The patient was provided an opportunity to ask questions and all were answered. The patient agreed with the plan and demonstrated an understanding of the instructions.   The patient was advised to call back or seek an in-person evaluation if the symptoms worsen or if the condition fails to improve as anticipated.   Ricky Stabs, NP 12/15/2020, 11:32 AM Primary Care at [facility]

## 2020-12-13 DIAGNOSIS — D509 Iron deficiency anemia, unspecified: Secondary | ICD-10-CM | POA: Insufficient documentation

## 2020-12-13 LAB — IRON,TIBC AND FERRITIN PANEL
Ferritin: 10 ng/mL — ABNORMAL LOW (ref 15–150)
Iron Saturation: 5 % — CL (ref 15–55)
Iron: 22 ug/dL — ABNORMAL LOW (ref 27–159)
Total Iron Binding Capacity: 476 ug/dL — ABNORMAL HIGH (ref 250–450)
UIBC: 454 ug/dL — ABNORMAL HIGH (ref 131–425)

## 2020-12-13 LAB — CBC
Hematocrit: 32.4 % — ABNORMAL LOW (ref 34.0–46.6)
Hemoglobin: 9.2 g/dL — ABNORMAL LOW (ref 11.1–15.9)
MCH: 21.6 pg — ABNORMAL LOW (ref 26.6–33.0)
MCHC: 28.4 g/dL — ABNORMAL LOW (ref 31.5–35.7)
MCV: 76 fL — ABNORMAL LOW (ref 79–97)
Platelets: 443 10*3/uL (ref 150–450)
RBC: 4.26 x10E6/uL (ref 3.77–5.28)
RDW: 23.1 % — ABNORMAL HIGH (ref 11.7–15.4)
WBC: 7 10*3/uL (ref 3.4–10.8)

## 2020-12-13 MED ORDER — SODIUM CHLORIDE 0.9 % IV SOLN: INTRAVENOUS | 0 refills | Status: DC

## 2020-12-13 NOTE — Progress Notes (Signed)
Patient has low iron and will need two iron infusions. The nurse will call to schedule appointments for patient. Patient encouraged to have iron lab rechecked at least 1 month after completion of second iron infusion.

## 2020-12-14 ENCOUNTER — Other Ambulatory Visit: Payer: Self-pay

## 2020-12-14 DIAGNOSIS — D509 Iron deficiency anemia, unspecified: Secondary | ICD-10-CM

## 2020-12-14 MED ORDER — SODIUM CHLORIDE 0.9 % IV SOLN: INTRAVENOUS | 0 refills | Status: AC

## 2020-12-14 MED ORDER — SODIUM CHLORIDE 0.9 % IV SOLN: INTRAVENOUS | 0 refills | Status: DC

## 2020-12-15 MED ORDER — AMLODIPINE BESYLATE 10 MG PO TABS: 10.0000 mg | ORAL_TABLET | Freq: Every day | ORAL | 0 refills | Status: DC

## 2020-12-25 ENCOUNTER — Non-Acute Institutional Stay (HOSPITAL_COMMUNITY)
Admission: RE | Admit: 2020-12-25 | Discharge: 2020-12-25 | Disposition: A | Discharge status: Home / Self Care | Payer: Medicaid - Out of State | Source: Ambulatory Visit | Attending: Internal Medicine | Admitting: Internal Medicine

## 2020-12-25 ENCOUNTER — Other Ambulatory Visit: Payer: Self-pay

## 2020-12-25 DIAGNOSIS — D509 Iron deficiency anemia, unspecified: Secondary | ICD-10-CM | POA: Diagnosis present

## 2020-12-25 MED ORDER — SODIUM CHLORIDE 0.9 % IV SOLN
INTRAVENOUS | Status: DC | PRN
  Administered 2020-12-25: 250 mL via INTRAVENOUS

## 2020-12-25 MED ORDER — SODIUM CHLORIDE 0.9 % IV SOLN
510.0000 mg | Freq: Once | INTRAVENOUS | Status: AC
  Administered 2020-12-25: 510 mg via INTRAVENOUS
  Filled 2020-12-25: qty 510

## 2020-12-25 NOTE — Discharge Instructions (Signed)
Ferumoxytol injection What is this medicine? FERUMOXYTOL is an iron complex. Iron is used to make healthy red blood cells, which carry oxygen and nutrients throughout the body. This medicine is used to treat iron deficiency anemia. This medicine may be used for other purposes; ask your health care provider or pharmacist if you have questions. COMMON BRAND NAME(S): Feraheme What should I tell my health care provider before I take this medicine? They need to know if you have any of these conditions:  anemia not caused by low iron levels  high levels of iron in the blood  magnetic resonance imaging (MRI) test scheduled  an unusual or allergic reaction to iron, other medicines, foods, dyes, or preservatives  pregnant or trying to get pregnant  breast-feeding How should I use this medicine? This medicine is for injection into a vein. It is given by a health care professional in a hospital or clinic setting. Talk to your pediatrician regarding the use of this medicine in children. Special care may be needed. Overdosage: If you think you have taken too much of this medicine contact a poison control center or emergency room at once. NOTE: This medicine is only for you. Do not share this medicine with others. What if I miss a dose? It is important not to miss your dose. Call your doctor or health care professional if you are unable to keep an appointment. What may interact with this medicine? This medicine may interact with the following medications:  other iron products This list may not describe all possible interactions. Give your health care provider a list of all the medicines, herbs, non-prescription drugs, or dietary supplements you use. Also tell them if you smoke, drink alcohol, or use illegal drugs. Some items may interact with your medicine. What should I watch for while using this medicine? Visit your doctor or healthcare professional regularly. Tell your doctor or healthcare  professional if your symptoms do not start to get better or if they get worse. You may need blood work done while you are taking this medicine. You may need to follow a special diet. Talk to your doctor. Foods that contain iron include: whole grains/cereals, dried fruits, beans, or peas, leafy green vegetables, and organ meats (liver, kidney). What side effects may I notice from receiving this medicine? Side effects that you should report to your doctor or health care professional as soon as possible:  allergic reactions like skin rash, itching or hives, swelling of the face, lips, or tongue  breathing problems  changes in blood pressure  feeling faint or lightheaded, falls  fever or chills  flushing, sweating, or hot feelings  swelling of the ankles or feet Side effects that usually do not require medical attention (report to your doctor or health care professional if they continue or are bothersome):  diarrhea  headache  nausea, vomiting  stomach pain This list may not describe all possible side effects. Call your doctor for medical advice about side effects. You may report side effects to FDA at 1-800-FDA-1088. Where should I keep my medicine? This drug is given in a hospital or clinic and will not be stored at home. NOTE: This sheet is a summary. It may not cover all possible information. If you have questions about this medicine, talk to your doctor, pharmacist, or health care provider.  2021 Elsevier/Gold Standard (2016-10-27 20:21:10)  

## 2020-12-25 NOTE — Progress Notes (Signed)
Patient received IV Feraheme (dose 1 of 2) as ordered by Ricky Stabs NP. Observed for at least 30 minutes post infusion. Tolerated well, vitals stable, discharge instructions given , verbalized understanding. Pt states she has second infusion already scheduled for 01/02/21. Patient alert, oriented, and ambulatory at the time of discharge.

## 2021-01-02 ENCOUNTER — Encounter (HOSPITAL_COMMUNITY): Payer: Medicaid - Out of State

## 2021-01-14 ENCOUNTER — Encounter: Payer: Medicaid - Out of State | Admitting: Internal Medicine

## 2021-01-17 ENCOUNTER — Telehealth (HOSPITAL_COMMUNITY): Payer: Self-pay

## 2021-01-17 ENCOUNTER — Telehealth: Payer: Self-pay | Admitting: Internal Medicine

## 2021-01-17 ENCOUNTER — Other Ambulatory Visit: Payer: Self-pay

## 2021-01-17 ENCOUNTER — Non-Acute Institutional Stay (HOSPITAL_COMMUNITY)
Admission: RE | Admit: 2021-01-17 | Discharge: 2021-01-17 | Disposition: A | Discharge status: Home / Self Care | Payer: Medicaid - Out of State | Source: Ambulatory Visit | Attending: Internal Medicine | Admitting: Internal Medicine

## 2021-01-17 DIAGNOSIS — D509 Iron deficiency anemia, unspecified: Secondary | ICD-10-CM | POA: Diagnosis not present

## 2021-01-17 MED ORDER — SODIUM CHLORIDE 0.9 % IV SOLN
INTRAVENOUS | Status: DC | PRN
  Administered 2021-01-17: 250 mL via INTRAVENOUS

## 2021-01-17 MED ORDER — SODIUM CHLORIDE 0.9 % IV SOLN
510.0000 mg | Freq: Once | INTRAVENOUS | Status: AC
  Administered 2021-01-17: 510 mg via INTRAVENOUS
  Filled 2021-01-17: qty 510

## 2021-01-17 NOTE — Telephone Encounter (Signed)
Spoke with Diana Hess at [facility] at [facility], explained that pt last received feraheme on 12/25/20, and missed subsequent appointment for second dose of iron, as it was ordered to be given 3-8 days after first iron infusion. Pt is at patient care center today to receive second dose of iron, verbal approval needed in order to administer. Per Dr. Earlene Plater, verbal approval given to administer feraheme dose #2 today.

## 2021-01-17 NOTE — Discharge Instructions (Signed)
Ferumoxytol injection What is this medicine? FERUMOXYTOL is an iron complex. Iron is used to make healthy red blood cells, which carry oxygen and nutrients throughout the body. This medicine is used to treat iron deficiency anemia. This medicine may be used for other purposes; ask your health care provider or pharmacist if you have questions. COMMON BRAND NAME(S): Feraheme What should I tell my health care provider before I take this medicine? They need to know if you have any of these conditions:  anemia not caused by low iron levels  high levels of iron in the blood  magnetic resonance imaging (MRI) test scheduled  an unusual or allergic reaction to iron, other medicines, foods, dyes, or preservatives  pregnant or trying to get pregnant  breast-feeding How should I use this medicine? This medicine is for injection into a vein. It is given by a health care professional in a hospital or clinic setting. Talk to your pediatrician regarding the use of this medicine in children. Special care may be needed. Overdosage: If you think you have taken too much of this medicine contact a poison control center or emergency room at once. NOTE: This medicine is only for you. Do not share this medicine with others. What if I miss a dose? It is important not to miss your dose. Call your doctor or health care professional if you are unable to keep an appointment. What may interact with this medicine? This medicine may interact with the following medications:  other iron products This list may not describe all possible interactions. Give your health care provider a list of all the medicines, herbs, non-prescription drugs, or dietary supplements you use. Also tell them if you smoke, drink alcohol, or use illegal drugs. Some items may interact with your medicine. What should I watch for while using this medicine? Visit your doctor or healthcare professional regularly. Tell your doctor or healthcare  professional if your symptoms do not start to get better or if they get worse. You may need blood work done while you are taking this medicine. You may need to follow a special diet. Talk to your doctor. Foods that contain iron include: whole grains/cereals, dried fruits, beans, or peas, leafy green vegetables, and organ meats (liver, kidney). What side effects may I notice from receiving this medicine? Side effects that you should report to your doctor or health care professional as soon as possible:  allergic reactions like skin rash, itching or hives, swelling of the face, lips, or tongue  breathing problems  changes in blood pressure  feeling faint or lightheaded, falls  fever or chills  flushing, sweating, or hot feelings  swelling of the ankles or feet Side effects that usually do not require medical attention (report to your doctor or health care professional if they continue or are bothersome):  diarrhea  headache  nausea, vomiting  stomach pain This list may not describe all possible side effects. Call your doctor for medical advice about side effects. You may report side effects to FDA at 1-800-FDA-1088. Where should I keep my medicine? This drug is given in a hospital or clinic and will not be stored at home. NOTE: This sheet is a summary. It may not cover all possible information. If you have questions about this medicine, talk to your doctor, pharmacist, or health care provider.  2021 Elsevier/Gold Standard (2016-10-27 20:21:10)  

## 2021-01-17 NOTE — Telephone Encounter (Signed)
Diana Hess calld stating the pt was receiving her Iron Feraheme and wanted to let her PCP know there is a 3wk gap in between her doses.

## 2021-01-17 NOTE — Telephone Encounter (Signed)
Dr. Earlene Plater was made aware and gave the verbal consent to get fusion today

## 2021-01-17 NOTE — Progress Notes (Signed)
Patient received IV Feraheme ( dose 2/2)  as ordered by Ricky Stabs NP. Prior to infusion, clarified with provider Dr. Earlene Plater at [facility] at [facility], and received verbal approval to administer iron due to pt receiving first dose 3 weeks ago. Observed for at least 30 minutes post infusion. Tolerated well, vitals stable, discharge instructions given , verbalized understanding. Patient alert, oriented, and ambulatory at the time of discharge.

## 2021-03-21 ENCOUNTER — Ambulatory Visit: Payer: BC Managed Care – PPO | Admitting: Physician Assistant

## 2021-03-21 ENCOUNTER — Other Ambulatory Visit: Payer: Self-pay

## 2021-03-21 ENCOUNTER — Other Ambulatory Visit (HOSPITAL_COMMUNITY)
Admission: RE | Admit: 2021-03-21 | Discharge: 2021-03-21 | Disposition: A | Discharge status: Home / Self Care | Payer: BC Managed Care – PPO | Source: Ambulatory Visit | Attending: Physician Assistant | Admitting: Physician Assistant

## 2021-03-21 ENCOUNTER — Encounter: Payer: Self-pay | Admitting: Physician Assistant

## 2021-03-21 VITALS — BP 136/85 | HR 100 | Temp 98.6°F | Resp 18 | Ht 66.0 in

## 2021-03-21 DIAGNOSIS — N760 Acute vaginitis: Secondary | ICD-10-CM | POA: Diagnosis not present

## 2021-03-21 DIAGNOSIS — Z113 Encounter for screening for infections with a predominantly sexual mode of transmission: Secondary | ICD-10-CM | POA: Insufficient documentation

## 2021-03-21 DIAGNOSIS — Z7689 Persons encountering health services in other specified circumstances: Secondary | ICD-10-CM

## 2021-03-21 DIAGNOSIS — B9689 Other specified bacterial agents as the cause of diseases classified elsewhere: Secondary | ICD-10-CM | POA: Diagnosis not present

## 2021-03-21 DIAGNOSIS — R5383 Other fatigue: Secondary | ICD-10-CM

## 2021-03-21 DIAGNOSIS — D509 Iron deficiency anemia, unspecified: Secondary | ICD-10-CM | POA: Diagnosis not present

## 2021-03-21 DIAGNOSIS — I10 Essential (primary) hypertension: Secondary | ICD-10-CM | POA: Diagnosis not present

## 2021-03-21 DIAGNOSIS — E559 Vitamin D deficiency, unspecified: Secondary | ICD-10-CM

## 2021-03-21 LAB — POCT URINALYSIS DIP (CLINITEK)
Bilirubin, UA: NEGATIVE
Blood, UA: NEGATIVE
Glucose, UA: NEGATIVE mg/dL
Ketones, POC UA: NEGATIVE mg/dL
Leukocytes, UA: NEGATIVE
Nitrite, UA: NEGATIVE
POC PROTEIN,UA: NEGATIVE
Spec Grav, UA: 1.02 (ref 1.010–1.025)
Urobilinogen, UA: 1 E.U./dL
pH, UA: 7 (ref 5.0–8.0)

## 2021-03-21 LAB — POCT GLYCOSYLATED HEMOGLOBIN (HGB A1C): Hemoglobin A1C: 5.6 % (ref 4.0–5.6)

## 2021-03-21 MED ORDER — FERROUS SULFATE 324 (65 FE) MG PO TBEC: 1.0000 | DELAYED_RELEASE_TABLET | ORAL | 0 refills | Status: DC

## 2021-03-21 MED ORDER — METRONIDAZOLE 500 MG PO TABS
500.0000 mg | ORAL_TABLET | Freq: Two times a day (BID) | ORAL | 0 refills | Status: AC
Start: 2021-03-21 — End: 2021-03-28

## 2021-03-21 MED ORDER — AMLODIPINE BESYLATE 10 MG PO TABS: 10.0000 mg | ORAL_TABLET | Freq: Every day | ORAL | 0 refills | Status: DC

## 2021-03-21 NOTE — Progress Notes (Signed)
Established Patient Office Visit  Subjective:  Patient ID: Diana Hess, female    DOB: [birth year]  Age: 40 y.o. MRN: [id number]  CC:  Chief Complaint  Patient presents with   Vaginal Discharge    HPI Diana Hess reports that she has been feeling fatigued throughout the day despite sleeping well at night.  Reports that she has not been taking a daily iron supplement.  Reports that she did feel better after having her 2 iron infusions.  Reports that she would like to be tested for STIs, states that she did have an episode of unprotected intercourse, states that 2 days later she started experiencing some suprapubic cramping, did have 1 episode of clear discharge.  Reports that the suprapubic cramping has continued intermittently for the last 2 weeks.  Reports that she just finished her menstrual cycle.  States that she never started the vitamin D as prescribed.   Past Medical History:  Diagnosis Date   Hypertension    Multiple sclerosis (HCC)     Past Surgical History:  Procedure Laterality Date   CESAREAN SECTION     CESAREAN SECTION N/A    Phreesia 03/26/2020    Family History  Problem Relation Age of Onset   Healthy Mother    Healthy Father     Social History   Socioeconomic History   Marital status: Single    Spouse name: Not on file   Number of children: Not on file   Years of education: Not on file   Highest education level: Not on file  Occupational History   Not on file  Tobacco Use   Smoking status: Every Day    Pack years: 0.00   Smokeless tobacco: Never  Substance and Sexual Activity   Alcohol use: Yes   Drug use: Never   Sexual activity: Not on file  Other Topics Concern   Not on file  Social History Narrative   Not on file   Social Determinants of Health   Financial Resource Strain: Not on file  Food Insecurity: Not on file  Transportation Needs: Not on file  Physical Activity: Not on file  Stress: Not on file  Social  Connections: Not on file  Intimate Partner Violence: Not on file    Outpatient Medications Prior to Visit  Medication Sig Dispense Refill   ferumoxytol in sodium chloride 0.9 % 100 mL ordered dose: 510 mg; route: intravenous; frequency: once @ 468 mL/hr over 15 minutes; dispense: 1 each; admin instructions: Monitor patients carefully for allergic reactions during Feraheme administration and for at least 30 minutes following each infusion. Allow minimum of 30 minutes between Feraheme and admin of meds that could potentially cause hypersensitivity reactions, such as chemotherapeutic agents or monoclonal antibodies. after 3 to 8 days, administer a second dose of 510 mg once. 2 Dose 0   mupirocin ointment (BACTROBAN) 2 % APPLY 1 APPLICATION TOPICALLY 2 (TWO) TIMES DAILY. 22 g 0   amLODipine (NORVASC) 10 MG tablet Take 1 tablet (10 mg total) by mouth daily. 120 tablet 0   ferrous sulfate 324 (65 Fe) MG TBEC Take 1 tablet (325 mg total) by mouth every other day. 90 tablet 0   fluconazole (DIFLUCAN) 150 MG tablet TAKE 1 TABLET (150 MG TOTAL) BY MOUTH ONCE FOR 1 DOSE. IF NEEDED AFTER ANTIBIOTICS FOR YEAST INFECTION 1 tablet 0   No facility-administered medications prior to visit.    Allergies  Allergen Reactions   Lisinopril Cough    ROS Review of  Systems  Constitutional:  Positive for fatigue.  HENT: Negative.    Eyes: Negative.   Respiratory:  Negative for shortness of breath.   Cardiovascular:  Negative for chest pain and palpitations.  Gastrointestinal:  Positive for abdominal pain.  Endocrine: Negative.   Genitourinary:  Positive for vaginal discharge. Negative for dysuria, genital sores and hematuria.  Musculoskeletal: Negative.   Skin: Negative.   Allergic/Immunologic: Negative.   Neurological:  Negative for dizziness, weakness and headaches.  Hematological: Negative.   Psychiatric/Behavioral: Negative.       Objective:    Physical Exam Vitals and nursing note reviewed.   Constitutional:      Appearance: Normal appearance.  HENT:     Head: Normocephalic and atraumatic.     Right Ear: External ear normal.     Left Ear: External ear normal.     Nose: Nose normal.     Mouth/Throat:     Mouth: Mucous membranes are moist.     Pharynx: Oropharynx is clear.  Eyes:     Extraocular Movements: Extraocular movements intact.     Conjunctiva/sclera: Conjunctivae normal.     Pupils: Pupils are equal, round, and reactive to light.  Cardiovascular:     Rate and Rhythm: Normal rate and regular rhythm.     Pulses: Normal pulses.     Heart sounds: Normal heart sounds.  Pulmonary:     Effort: Pulmonary effort is normal.     Breath sounds: Normal breath sounds.  Abdominal:     Tenderness: There is abdominal tenderness in the suprapubic area. There is no right CVA tenderness or left CVA tenderness.  Musculoskeletal:        General: Normal range of motion.     Cervical back: Normal range of motion and neck supple.  Skin:    General: Skin is warm and dry.  Neurological:     General: No focal deficit present.     Mental Status: She is alert and oriented to person, place, and time.  Psychiatric:        Mood and Affect: Mood normal.        Behavior: Behavior normal.        Thought Content: Thought content normal.        Judgment: Judgment normal.    BP 136/85 (BP Location: Right Arm, Patient Position: Sitting, Cuff Size: Normal)   Pulse 100   Temp 98.6 F (37 C) (Oral)   Resp 18   Ht $R'5\' 6"'ud$  (1.676 m)   LMP 03/16/2021   SpO2 97%   BMI 39.22 kg/m  Wt Readings from Last 3 Encounters:  12/12/20 243 lb (110.2 kg)  08/23/20 229 lb (103.9 kg)  04/27/20 250 lb (113.4 kg)     Health Maintenance Due  Topic Date Due   COVID-19 Vaccine (1) Never done   Pneumococcal Vaccine 71-38 Years old (1 - PCV) Never done   PAP SMEAR-Modifier  Never done    There are no preventive care reminders to display for this patient.  Lab Results  Component Value Date   TSH 0.711  03/29/2020   Lab Results  Component Value Date   WBC 7.0 03/21/2021   HGB 11.3 03/21/2021   HCT 35.3 03/21/2021   MCV 83 03/21/2021   PLT 453 (H) 03/21/2021   Lab Results  Component Value Date   NA 137 03/21/2021   K 3.9 03/21/2021   CO2 22 03/29/2020   GLUCOSE 100 (H) 03/21/2021   BUN 8 03/21/2021   CREATININE  0.83 03/21/2021   BILITOT 0.2 03/21/2021   ALKPHOS 70 03/21/2021   AST 8 03/21/2021   ALT 10 03/29/2020   PROT 7.3 03/21/2021   ALBUMIN 4.4 03/21/2021   CALCIUM 9.1 03/21/2021   EGFR 91 03/21/2021   No results found for: CHOL No results found for: HDL No results found for: LDLCALC No results found for: TRIG No results found for: CHOLHDL Lab Results  Component Value Date   HGBA1C 5.6 03/21/2021      Assessment & Plan:   Problem List Items Addressed This Visit       Cardiovascular and Mediastinum   Essential hypertension   Relevant Medications   amLODipine (NORVASC) 10 MG tablet   Other Relevant Orders   Comp. Metabolic Panel (12) (Completed)     Genitourinary   Bacterial vaginosis   Relevant Medications   metroNIDAZOLE (FLAGYL) 500 MG tablet     Other   Vitamin D deficiency   Relevant Orders   Vitamin D, 25-hydroxy (Completed)   Iron deficiency anemia - Primary   Relevant Medications   ferrous sulfate 324 (65 Fe) MG TBEC   Other Relevant Orders   CBC with Differential/Platelet (Completed)   Iron, TIBC and Ferritin Panel (Completed)   Other fatigue   Other Visit Diagnoses     Encounter for assessment of STD exposure       Relevant Orders   HgB A1c (Completed)   Cervicovaginal ancillary only   POCT URINALYSIS DIP (CLINITEK) (Completed)       Meds ordered this encounter  Medications   metroNIDAZOLE (FLAGYL) 500 MG tablet    Sig: Take 1 tablet (500 mg total) by mouth 2 (two) times daily for 7 days.    Dispense:  14 tablet    Refill:  0    Order Specific Question:   Supervising Provider    Answer:   Storm Frisk [[id number]]    ferrous sulfate 324 (65 Fe) MG TBEC    Sig: Take 1 tablet (325 mg total) by mouth every other day.    Dispense:  90 tablet    Refill:  0    Order Specific Question:   Supervising Provider    Answer:   Shan Levans E [[id number]]   amLODipine (NORVASC) 10 MG tablet    Sig: Take 1 tablet (10 mg total) by mouth daily.    Dispense:  120 tablet    Refill:  0    Order Specific Question:   Supervising Provider    Answer:   Delford Field, PATRICK E [[id number]]  1. Iron deficiency anemia, unspecified iron deficiency anemia type Encourage patient to resume iron on a daily basis.  Red flags given for prompt reevaluation - CBC with Differential/Platelet - Iron, TIBC and Ferritin Panel - ferrous sulfate 324 (65 Fe) MG TBEC; Take 1 tablet (325 mg total) by mouth every other day.  Dispense: 90 tablet; Refill: 0  2. Other fatigue   3. Essential hypertension Refill requested, continue current regimen - Comp. Metabolic Panel (12) - amLODipine (NORVASC) 10 MG tablet; Take 1 tablet (10 mg total) by mouth daily.  Dispense: 120 tablet; Refill: 0  4. Bacterial vaginosis Trial Flagyl.  Patient education given on supportive care - metroNIDAZOLE (FLAGYL) 500 MG tablet; Take 1 tablet (500 mg total) by mouth 2 (two) times daily for 7 days.  Dispense: 14 tablet; Refill: 0  5. Encounter for assessment of STD exposure  - HgB A1c - Cervicovaginal ancillary only - POCT URINALYSIS DIP (CLINITEK)  6. Vitamin D deficiency  - Vitamin D, 25-hydroxy   I have reviewed the patient's medical history (PMH, PSH, Social History, Family History, Medications, and allergies) , and have been updated if relevant. I spent 32 minutes reviewing chart and  face to face time with patient.    Follow-up: Return in about 1 month (around 04/22/2021) for with Durene Fruits, NP at [place] at [facility].    Loraine Grip Mayers, PA-C

## 2021-03-21 NOTE — Patient Instructions (Signed)
To help with your lower abdominal pain and vaginal discharge, you are going to take Flagyl twice a day for the next 7 days.  I encourage you to take your iron on a daily basis.  I sent a refill to your pharmacy.  We will call you with today's lab results.  Roney Jaffe, PA-C Physician Assistant [facility] [email or web address]    Iron Deficiency Anemia, Adult Iron deficiency anemia is a condition in which the concentration of red blood cells or hemoglobin in the blood is below normal because of too little iron. Hemoglobin is a substance in red blood cells that carries oxygen to the body's tissues. When the concentration of red blood cells or hemoglobin is too low,not enough oxygen reaches these tissues. Iron deficiency anemia is usually long-lasting, and it develops over time. Itmay or may not cause symptoms. It is a common type of anemia. What are the causes? This condition may be caused by: Not enough iron in the diet. Abnormal absorption in the gut. Increased need for iron because of pregnancy or heavy menstrual periods, for females. Cancers of the gastrointestinal system, such as colon cancer. Blood loss caused by bleeding in the intestine. This may be from a gastrointestinal condition like Crohn's disease. Frequent blood draws, such as from blood donation. What increases the risk? The following factors may make you more likely to develop this condition: Being pregnant. Being a teenage girl going through a growth spurt. What are the signs or symptoms? Symptoms of this condition may include: Pale skin, lips, and nail beds. Weakness, dizziness, and getting tired easily. Headache. Shortness of breath when moving or exercising. Cold hands and feet. Fast or irregular heartbeat. Irritability or rapid breathing. These are more common in severe anemia. Mild anemia may not cause any symptoms. How is this diagnosed? This  condition is diagnosed based on: Your medical history. A physical exam. Blood tests. You may have additional tests to find the underlying cause of your anemia, such as: Testing for blood in the stool (fecal occult blood test). A procedure to see inside your colon and rectum (colonoscopy). A procedure to see inside your esophagus and stomach (endoscopy). A test in which cells are removed from bone marrow (bone marrow aspiration) or fluid is removed from the bone marrow to be examined. This is rarely needed. How is this treated? This condition is treated by correcting the cause of your iron deficiency. Treatment may involve: Adding iron-rich foods to your diet. Taking iron supplements. If you are pregnant or breastfeeding, you may need to take extra iron because your normal diet usually does not provide the amount of iron that you need. Increasing vitamin C intake. Vitamin C helps your body absorb iron. Your health care provider may recommend that you take iron supplements along with a glass of orange juice or a vitamin C supplement. Medicines to make heavy menstrual flow lighter. Surgery. You may need repeat blood tests to determine whether treatment is working. Ifthe treatment does not seem to be working, you may need more tests. Follow these instructions at home: Medicines Take over-the-counter and prescription medicines only as told by your health care provider. This includes iron supplements and vitamins. For the best iron absorption, you should take iron supplements when your stomach is empty. If you cannot tolerate them on an empty stomach, you may need to take them with food. Do not drink milk or take antacids at the same time as your iron supplements. Milk and antacids may  interfere with iron absorption. Iron supplements may turn stool (feces) a darker color and it may appear black. If you cannot tolerate taking iron supplements by mouth, talk with your health care provider about taking  them through an IV or through an injection into a muscle. Eating and drinking  Talk with your health care provider before changing your diet. He or she may recommend that you eat foods that contain a lot of iron, such as: Liver. Low-fat (lean) beef. Breads and cereals that have iron added to them (are fortified). Eggs. Dried fruit. Dark green, leafy vegetables. To help your body use the iron from iron-rich foods, eat those foods at the same time as fresh fruits and vegetables that are high in vitamin C. Foods that are high in vitamin C include: Oranges. Peppers. Tomatoes. Mangoes. Drink enough fluid to keep your urine pale yellow.  Managing constipation If you are taking an iron supplement, it may cause constipation. To prevent or treat constipation, you may need to: Take over-the-counter or prescription medicines. Eat foods that are high in fiber, such as beans, whole grains, and fresh fruits and vegetables. Limit foods that are high in fat and processed sugars, such as fried or sweet foods. General instructions Return to your normal activities as told by your health care provider. Ask your health care provider what activities are safe for you. Practice good hygiene. Anemia can make you more prone to illness and infection. Keep all follow-up visits as told by your health care provider. This is important. Contact a health care provider if you: Feel nauseous or you vomit. Feel weak. Have unexplained sweating. Develop symptoms of constipation, such as: Having fewer than three bowel movements a week. Straining to have a bowel movement. Having stools that are hard, dry, or larger than normal. Feeling full or bloated. Pain in the lower abdomen. Not feeling relief after having a bowel movement. Get help right away if you: Faint. If this happens, do not drive yourself to the hospital. Have chest pain. Have shortness of breath that: Is severe. Gets worse with physical  activity. Have an irregular or rapid heartbeat. Become light-headed when getting up from a sitting or lying down position. These symptoms may represent a serious problem that is an emergency. Do not wait to see if the symptoms will go away. Get medical help right away. Call your local emergency services (911 in the [place] Do not drive yourself to the hospital. Summary Iron deficiency anemia is a condition in which the concentration of red blood cells or hemoglobin in the blood is below normal because of too little iron. This condition is treated by correcting the cause of your iron deficiency. Take over-the-counter and prescription medicines only as told by your health care provider. This includes iron supplements and vitamins. To help your body use the iron from iron-rich foods, eat those foods at the same time as fresh fruits and vegetables that are high in vitamin C. Get help right away if you have shortness of breath that gets worse with physical activity. This information is not intended to replace advice given to you by your health care provider. Make sure you discuss any questions you have with your healthcare provider. Document Revised: 05/17/2019 Document Reviewed: 05/17/2019 Elsevier Patient Education  2022 [employer].

## 2021-03-21 NOTE — Progress Notes (Signed)
Patient reports last sexual intercourse being 2 weeks ago. Patient recently was informed partner has two other partners./ Patient has intermittent burning after urination and a clear odorless discharge with abdominal cramping. Patient has eaten today.

## 2021-03-22 ENCOUNTER — Encounter: Payer: Self-pay | Admitting: Physician Assistant

## 2021-03-22 DIAGNOSIS — R5383 Other fatigue: Secondary | ICD-10-CM | POA: Insufficient documentation

## 2021-03-22 DIAGNOSIS — N760 Acute vaginitis: Secondary | ICD-10-CM | POA: Insufficient documentation

## 2021-03-22 DIAGNOSIS — B9689 Other specified bacterial agents as the cause of diseases classified elsewhere: Secondary | ICD-10-CM | POA: Insufficient documentation

## 2021-03-22 LAB — CBC WITH DIFFERENTIAL/PLATELET
Basophils Absolute: 0.1 10*3/uL (ref 0.0–0.2)
Basos: 1 %
EOS (ABSOLUTE): 0.2 10*3/uL (ref 0.0–0.4)
Eos: 3 %
Hematocrit: 35.3 % (ref 34.0–46.6)
Hemoglobin: 11.3 g/dL (ref 11.1–15.9)
Immature Grans (Abs): 0 10*3/uL (ref 0.0–0.1)
Immature Granulocytes: 0 %
Lymphocytes Absolute: 1 10*3/uL (ref 0.7–3.1)
Lymphs: 15 %
MCH: 26.4 pg — ABNORMAL LOW (ref 26.6–33.0)
MCHC: 32 g/dL (ref 31.5–35.7)
MCV: 83 fL (ref 79–97)
Monocytes Absolute: 0.4 10*3/uL (ref 0.1–0.9)
Monocytes: 6 %
Neutrophils Absolute: 5.2 10*3/uL (ref 1.4–7.0)
Neutrophils: 75 %
Platelets: 453 10*3/uL — ABNORMAL HIGH (ref 150–450)
RBC: 4.28 x10E6/uL (ref 3.77–5.28)
RDW: 17.4 % — ABNORMAL HIGH (ref 11.7–15.4)
WBC: 7 10*3/uL (ref 3.4–10.8)

## 2021-03-22 LAB — VITAMIN D 25 HYDROXY (VIT D DEFICIENCY, FRACTURES): Vit D, 25-Hydroxy: 11.8 ng/mL — ABNORMAL LOW (ref 30.0–100.0)

## 2021-03-22 LAB — COMP. METABOLIC PANEL (12)
AST: 8 IU/L (ref 0–40)
Albumin/Globulin Ratio: 1.5 (ref 1.2–2.2)
Albumin: 4.4 g/dL (ref 3.8–4.8)
Alkaline Phosphatase: 70 IU/L (ref 44–121)
BUN/Creatinine Ratio: 10 (ref 9–23)
BUN: 8 mg/dL (ref 6–24)
Bilirubin Total: 0.2 mg/dL (ref 0.0–1.2)
Calcium: 9.1 mg/dL (ref 8.7–10.2)
Chloride: 102 mmol/L (ref 96–106)
Creatinine, Ser: 0.83 mg/dL (ref 0.57–1.00)
Globulin, Total: 2.9 g/dL (ref 1.5–4.5)
Glucose: 100 mg/dL — ABNORMAL HIGH (ref 65–99)
Potassium: 3.9 mmol/L (ref 3.5–5.2)
Sodium: 137 mmol/L (ref 134–144)
Total Protein: 7.3 g/dL (ref 6.0–8.5)
eGFR: 91 mL/min/{1.73_m2} (ref >59)

## 2021-03-22 LAB — IRON,TIBC AND FERRITIN PANEL
Ferritin: 11 ng/mL — ABNORMAL LOW (ref 15–150)
Iron Saturation: 7 % — CL (ref 15–55)
Iron: 28 ug/dL (ref 27–159)
Total Iron Binding Capacity: 396 ug/dL (ref 250–450)
UIBC: 368 ug/dL (ref 131–425)

## 2021-03-22 LAB — CERVICOVAGINAL ANCILLARY ONLY
Bacterial Vaginitis (gardnerella): POSITIVE — AB
Candida Glabrata: NEGATIVE
Candida Vaginitis: NEGATIVE
Chlamydia: NEGATIVE
Comment: NEGATIVE
Comment: NEGATIVE
Comment: NEGATIVE
Comment: NEGATIVE
Comment: NEGATIVE
Comment: NORMAL
Neisseria Gonorrhea: NEGATIVE
Trichomonas: NEGATIVE

## 2021-03-22 MED ORDER — VITAMIN D (ERGOCALCIFEROL) 1.25 MG (50000 UNIT) PO CAPS: 50000.0000 [IU] | ORAL_CAPSULE | ORAL | 2 refills | Status: DC

## 2021-03-22 NOTE — Addendum Note (Signed)
Addended by: Roney Jaffe on: 03/22/2021 07:17 AM   Modules accepted: Orders

## 2021-03-26 NOTE — Progress Notes (Signed)
Patient viewed results  via mychart with no concerns. 

## 2021-03-26 NOTE — Telephone Encounter (Signed)
-----   Message from Roney Jaffe, [place] sent at 03/22/2021  7:17 AM EDT ----- Please call patient and let her know that her iron levels continue to be low, she should continue taking iron on a daily basis.  She does need to have these levels rechecked in 6 weeks.  Her kidney function and liver function are within normal limits.  Her vitamin D is also very low.  It is important that she take 50,000 units once a week for the next 12 weeks and have her levels rechecked at that time.  Prescription sent to her pharmacy.

## 2021-04-21 NOTE — Progress Notes (Signed)
Patient did not show for appointment.   

## 2021-04-22 ENCOUNTER — Encounter: Payer: Medicaid - Out of State | Admitting: Family

## 2021-04-28 ENCOUNTER — Encounter (HOSPITAL_COMMUNITY): Payer: Self-pay | Admitting: Emergency Medicine

## 2021-04-28 ENCOUNTER — Other Ambulatory Visit: Payer: Self-pay

## 2021-04-28 ENCOUNTER — Emergency Department (HOSPITAL_COMMUNITY)
Admission: EM | Admit: 2021-04-28 | Discharge: 2021-04-29 | Disposition: A | Discharge status: Home / Self Care | Payer: BC Managed Care – PPO | Attending: Emergency Medicine | Admitting: Emergency Medicine

## 2021-04-28 ENCOUNTER — Ambulatory Visit (HOSPITAL_COMMUNITY)
Admission: EM | Admit: 2021-04-28 | Discharge: 2021-04-28 | Discharge status: Against Medical Advice | Payer: BC Managed Care – PPO | Attending: Emergency Medicine | Admitting: Emergency Medicine

## 2021-04-28 DIAGNOSIS — Z751 Person awaiting admission to adequate facility elsewhere: Secondary | ICD-10-CM

## 2021-04-28 DIAGNOSIS — K2970 Gastritis, unspecified, without bleeding: Secondary | ICD-10-CM | POA: Insufficient documentation

## 2021-04-28 DIAGNOSIS — K2900 Acute gastritis without bleeding: Secondary | ICD-10-CM

## 2021-04-28 DIAGNOSIS — N9489 Other specified conditions associated with female genital organs and menstrual cycle: Secondary | ICD-10-CM | POA: Insufficient documentation

## 2021-04-28 DIAGNOSIS — R1013 Epigastric pain: Secondary | ICD-10-CM | POA: Diagnosis present

## 2021-04-28 DIAGNOSIS — Z79899 Other long term (current) drug therapy: Secondary | ICD-10-CM | POA: Insufficient documentation

## 2021-04-28 DIAGNOSIS — R197 Diarrhea, unspecified: Secondary | ICD-10-CM

## 2021-04-28 DIAGNOSIS — I10 Essential (primary) hypertension: Secondary | ICD-10-CM | POA: Insufficient documentation

## 2021-04-28 DIAGNOSIS — F17200 Nicotine dependence, unspecified, uncomplicated: Secondary | ICD-10-CM | POA: Insufficient documentation

## 2021-04-28 DIAGNOSIS — R112 Nausea with vomiting, unspecified: Secondary | ICD-10-CM

## 2021-04-28 LAB — CBC WITH DIFFERENTIAL/PLATELET
Abs Immature Granulocytes: 0.02 10*3/uL (ref 0.00–0.07)
Basophils Absolute: 0.1 10*3/uL (ref 0.0–0.1)
Basophils Relative: 1 %
Eosinophils Absolute: 0.3 10*3/uL (ref 0.0–0.5)
Eosinophils Relative: 5 %
HCT: 33.9 % — ABNORMAL LOW (ref 36.0–46.0)
Hemoglobin: 10.6 g/dL — ABNORMAL LOW (ref 12.0–15.0)
Immature Granulocytes: 0 %
Lymphocytes Relative: 21 %
Lymphs Abs: 1.4 10*3/uL (ref 0.7–4.0)
MCH: 25.5 pg — ABNORMAL LOW (ref 26.0–34.0)
MCHC: 31.3 g/dL (ref 30.0–36.0)
MCV: 81.5 fL (ref 80.0–100.0)
Monocytes Absolute: 0.4 10*3/uL (ref 0.1–1.0)
Monocytes Relative: 6 %
Neutro Abs: 4.2 10*3/uL (ref 1.7–7.7)
Neutrophils Relative %: 67 %
Platelets: 438 10*3/uL — ABNORMAL HIGH (ref 150–400)
RBC: 4.16 MIL/uL (ref 3.87–5.11)
RDW: 15.2 % (ref 11.5–15.5)
WBC: 6.4 10*3/uL (ref 4.0–10.5)
nRBC: 0 % (ref 0.0–0.2)

## 2021-04-28 LAB — COMPREHENSIVE METABOLIC PANEL
ALT: 13 U/L (ref 0–44)
AST: 15 U/L (ref 15–41)
Albumin: 3.6 g/dL (ref 3.5–5.0)
Alkaline Phosphatase: 54 U/L (ref 38–126)
Anion gap: 9 (ref 5–15)
BUN: 9 mg/dL (ref 6–20)
CO2: 22 mmol/L (ref 22–32)
Calcium: 8.6 mg/dL — ABNORMAL LOW (ref 8.9–10.3)
Chloride: 105 mmol/L (ref 98–111)
Creatinine, Ser: 0.83 mg/dL (ref 0.44–1.00)
GFR, Estimated: >60 mL/min (ref >60)
Glucose, Bld: 92 mg/dL (ref 70–99)
Potassium: 3.9 mmol/L (ref 3.5–5.1)
Sodium: 136 mmol/L (ref 135–145)
Total Bilirubin: 0.5 mg/dL (ref 0.3–1.2)
Total Protein: 7.1 g/dL (ref 6.5–8.1)

## 2021-04-28 LAB — LIPASE, BLOOD: Lipase: 27 U/L (ref 11–51)

## 2021-04-28 LAB — I-STAT BETA HCG BLOOD, ED (MC, WL, AP ONLY): I-stat hCG, quantitative: <5 m[IU]/mL (ref <5)

## 2021-04-28 MED ORDER — ONDANSETRON 4 MG PO TBDP
4.0000 mg | ORAL_TABLET | Freq: Once | ORAL | Status: AC
  Administered 2021-04-28: 4 mg via ORAL
  Filled 2021-04-28: qty 1

## 2021-04-28 NOTE — ED Triage Notes (Signed)
Pt c/o nausea/vomiting/diarrhea after ETOH use last night.

## 2021-04-28 NOTE — ED Provider Notes (Signed)
Emergency Medicine Provider Triage Evaluation Note  Diana Hess , a 40 y.o. female  was evaluated in triage.  Pt complains of nausea, vomiting, and diarrhea that started today after drinking last night. She endorses 4 episodes of NBNB emesis and "constant" non-bloody diarrhea. No abdominal pain. No vaginal or urinary symptoms. She notes she feels dehydrated. No sick contacts or COVID exposures.  Review of Systems  Positive: N/V/D Negative: Abd pain  Physical Exam  BP (!) 150/98 (BP Location: Left Arm)   Pulse 83   Temp 97.9 F (36.6 C)   Resp 15   LMP 04/28/2021   SpO2 99%  Gen:   Awake, no distress   Resp:  Normal effort  MSK:   Moves extremities without difficulty  Other:  Abdomen soft, nondistended, nontender to palpation in all quadrants without guarding or peritoneal signs. No rebound.    Medical Decision Making  Medically screening exam initiated at 5:57 PM.  Appropriate orders placed.  Diana Hess was informed that the remainder of the evaluation will be completed by another provider, this initial triage assessment does not replace that evaluation, and the importance of remaining in the ED until their evaluation is complete.  Labs to check renal function and electrolytes.    Diana Hess 04/28/21 Althea Charon, MD 04/28/21 Rickey Primus

## 2021-04-28 NOTE — ED Notes (Signed)
Pt c/o abd pain, nausea, and vomiting since earlier this morning. Pt states she thinks it is related to her drinking alcohol last night.

## 2021-04-28 NOTE — ED Provider Notes (Signed)
[facility] EMERGENCY DEPARTMENT Provider Note   CSN: [id number] Arrival date & time: 04/28/21  1738     History Chief Complaint  Patient presents with   Emesis    Diana Hess is a 40 y.o. female.  Patient to ED with nausea, vomiting and epigastric abdominal pain since this morning. She reports she drank a lot of alcohol last night which is unusual for her, and woke today with symptoms that have persisted through the day. She reports feeling some better overall this afternoon, but nausea and discomfort continue.  No fever, no hematemesis.   The history is provided by the patient. No language interpreter was used.  Emesis Associated symptoms: abdominal pain and diarrhea   Associated symptoms: no chills, no cough and no fever       Past Medical History:  Diagnosis Date   Hypertension    Multiple sclerosis [facility])     Patient Active Problem List   Diagnosis Date Noted   Other fatigue 03/22/2021   Bacterial vaginosis 03/22/2021   Iron deficiency anemia 12/13/2020   Vitamin D deficiency 04/02/2020   Essential hypertension 03/29/2020    Past Surgical History:  Procedure Laterality Date   CESAREAN SECTION     CESAREAN SECTION N/A    Phreesia 03/26/2020     OB History   No obstetric history on file.     Family History  Problem Relation Age of Onset   Healthy Mother    Healthy Father     Social History   Tobacco Use   Smoking status: Every Day   Smokeless tobacco: Never  Substance Use Topics   Alcohol use: Yes   Drug use: Never    Home Medications Prior to Admission medications   Medication Sig Start Date End Date Taking? Authorizing Provider  amLODipine (NORVASC) 10 MG tablet Take 1 tablet (10 mg total) by mouth daily. 03/21/21   Mayers, Cari S, PA-C  ferrous sulfate 324 (65 Fe) MG TBEC Take 1 tablet (325 mg total) by mouth every other day. 03/21/21   Mayers, Cari S, PA-C  ferumoxytol in sodium chloride 0.9 % 100 mL ordered dose: 510  mg; route: intravenous; frequency: once @ 468 mL/hr over 15 minutes; dispense: 1 each; admin instructions: Monitor patients carefully for allergic reactions during Feraheme administration and for at least 30 minutes following each infusion. Allow minimum of 30 minutes between Feraheme and admin of meds that could potentially cause hypersensitivity reactions, such as chemotherapeutic agents or monoclonal antibodies. after 3 to 8 days, administer a second dose of 510 mg once. 12/14/20   Rema Fendt, NP  mupirocin ointment (BACTROBAN) 2 % APPLY 1 APPLICATION TOPICALLY 2 (TWO) TIMES DAILY. 08/23/20 08/23/21  Anders Simmonds, PA-C  Vitamin D, Ergocalciferol, (DRISDOL) 1.25 MG (50000 UNIT) CAPS capsule Take 1 capsule (50,000 Units total) by mouth every 7 (seven) days. 03/22/21   Mayers, Cari S, PA-C    Allergies    Lisinopril  Review of Systems   Review of Systems  Constitutional:  Negative for chills and fever.  HENT: Negative.    Respiratory: Negative.  Negative for cough and shortness of breath.   Cardiovascular: Negative.  Negative for chest pain.  Gastrointestinal:  Positive for abdominal pain, diarrhea, nausea and vomiting.  Musculoskeletal: Negative.   Skin: Negative.   Neurological: Negative.    Physical Exam Updated Vital Signs BP (!) 148/89 (BP Location: Right Arm)   Pulse 68   Temp 97.9 F (36.6 C)  Resp 18   LMP 04/28/2021   SpO2 98%   Physical Exam Vitals and nursing note reviewed.  Constitutional:      Appearance: She is well-developed.  HENT:     Head: Normocephalic.  Cardiovascular:     Rate and Rhythm: Normal rate and regular rhythm.     Heart sounds: No murmur heard. Pulmonary:     Effort: Pulmonary effort is normal.     Breath sounds: Normal breath sounds. No wheezing, rhonchi or rales.  Abdominal:     General: Bowel sounds are normal. There is no distension.     Palpations: Abdomen is soft.     Tenderness: There is no abdominal tenderness. There is no  guarding or rebound.  Musculoskeletal:        General: Normal range of motion.     Cervical back: Normal range of motion and neck supple.  Skin:    General: Skin is warm and dry.  Neurological:     General: No focal deficit present.     Mental Status: She is alert and oriented to person, place, and time.    ED Results / Procedures / Treatments   Labs (all labs ordered are listed, but only abnormal results are displayed) Labs Reviewed  CBC WITH DIFFERENTIAL/PLATELET - Abnormal; Notable for the following components:      Result Value   Hemoglobin 10.6 (*)    HCT 33.9 (*)    MCH 25.5 (*)    Platelets 438 (*)    All other components within normal limits  COMPREHENSIVE METABOLIC PANEL - Abnormal; Notable for the following components:   Calcium 8.6 (*)    All other components within normal limits  LIPASE, BLOOD  I-STAT BETA HCG BLOOD, ED ([facility], [facility], [facility] ONLY)   Results for orders placed or performed during the hospital encounter of 04/28/21  CBC with Differential  Result Value Ref Range   WBC 6.4 4.0 - 10.5 K/uL   RBC 4.16 3.87 - 5.11 MIL/uL   Hemoglobin 10.6 (L) 12.0 - 15.0 g/dL   HCT 45.0 (L) 38.8 - 82.8 %   MCV 81.5 80.0 - 100.0 fL   MCH 25.5 (L) 26.0 - 34.0 pg   MCHC 31.3 30.0 - 36.0 g/dL   RDW 00.3 49.1 - 79.1 %   Platelets 438 (H) 150 - 400 K/uL   nRBC 0.0 0.0 - 0.2 %   Neutrophils Relative % 67 %   Neutro Abs 4.2 1.7 - 7.7 K/uL   Lymphocytes Relative 21 %   Lymphs Abs 1.4 0.7 - 4.0 K/uL   Monocytes Relative 6 %   Monocytes Absolute 0.4 0.1 - 1.0 K/uL   Eosinophils Relative 5 %   Eosinophils Absolute 0.3 0.0 - 0.5 K/uL   Basophils Relative 1 %   Basophils Absolute 0.1 0.0 - 0.1 K/uL   Immature Granulocytes 0 %   Abs Immature Granulocytes 0.02 0.00 - 0.07 K/uL  Comprehensive metabolic panel  Result Value Ref Range   Sodium 136 135 - 145 mmol/L   Potassium 3.9 3.5 - 5.1 mmol/L   Chloride 105 98 - 111 mmol/L   CO2 22 22 - 32 mmol/L   Glucose, Bld 92 70 - 99 mg/dL    BUN 9 6 - 20 mg/dL   Creatinine, Ser 5.05 0.44 - 1.00 mg/dL   Calcium 8.6 (L) 8.9 - 10.3 mg/dL   Total Protein 7.1 6.5 - 8.1 g/dL   Albumin 3.6 3.5 - 5.0 g/dL   AST 15  15 - 41 U/L   ALT 13 0 - 44 U/L   Alkaline Phosphatase 54 38 - 126 U/L   Total Bilirubin 0.5 0.3 - 1.2 mg/dL   GFR, Estimated >67 >34 mL/min   Anion gap 9 5 - 15  Lipase, blood  Result Value Ref Range   Lipase 27 11 - 51 U/L  I-Stat Beta hCG blood, ED ([facility], [facility], [facility] only)  Result Value Ref Range   I-stat hCG, quantitative <5.0 <5 mIU/mL   Comment 3            EKG None  Radiology No results found.  Procedures Procedures   Medications Ordered in ED Medications  ondansetron (ZOFRAN-ODT) disintegrating tablet 4 mg (4 mg Oral Given 04/28/21 1804)    ED Course  I have reviewed the triage vital signs and the nursing notes.  Pertinent labs & imaging results that were available during my care of the patient were reviewed by me and considered in my medical decision making (see chart for details).    MDM Rules/Calculators/A&P                           Patient to ED with ss/sxs as per HPI after drinking heavily last night.   She is well appearing. In NAD, VSS. She received Zofran as part of the triage process and feels her nausea is better.  PO challenge offered. Will reassess.   No further vomiting. Pain is minimal. VSS. She is felt appropriate for discharge home.   Final Clinical Impression(s) / ED Diagnoses Final diagnoses:  None   Gastritis Nausea with vomiting  Rx / DC Orders ED Discharge Orders     None        Elpidio Anis, PA-C 04/29/21 0100    Dione Booze, MD 04/29/21 [phone]

## 2021-04-29 MED ORDER — ONDANSETRON 4 MG PO TBDP: 4.0000 mg | ORAL_TABLET | Freq: Three times a day (TID) | ORAL | 0 refills | Status: AC | PRN

## 2021-04-29 NOTE — Discharge Instructions (Addendum)
Recommend Pepcid twice daily for the next several days. Zofran as needed for nausea.   See your doctor if symptoms persist. Return to the ED with any new or concerning symptoms at any time.

## 2021-04-29 NOTE — ED Notes (Signed)
Pt discharged and ambulated out of the ED without difficulty. 

## 2021-05-06 ENCOUNTER — Encounter: Payer: Self-pay | Admitting: Family

## 2021-10-25 ENCOUNTER — Other Ambulatory Visit: Payer: Self-pay | Admitting: Physician Assistant

## 2021-10-25 DIAGNOSIS — I10 Essential (primary) hypertension: Secondary | ICD-10-CM

## 2021-10-28 ENCOUNTER — Other Ambulatory Visit: Payer: Self-pay | Admitting: Physician Assistant

## 2021-10-28 DIAGNOSIS — I10 Essential (primary) hypertension: Secondary | ICD-10-CM

## 2021-10-28 NOTE — Telephone Encounter (Signed)
Please refill if appropriate at PCP.

## 2021-10-28 NOTE — Telephone Encounter (Signed)
Call patient. Last appointment 12/12/2020. Schedule appointment soon for refills.

## 2021-11-12 ENCOUNTER — Other Ambulatory Visit (HOSPITAL_BASED_OUTPATIENT_CLINIC_OR_DEPARTMENT_OTHER): Payer: Self-pay

## 2021-11-23 ENCOUNTER — Other Ambulatory Visit: Payer: Self-pay | Admitting: Physician Assistant

## 2021-11-23 ENCOUNTER — Other Ambulatory Visit: Payer: Self-pay | Admitting: Family

## 2021-11-23 DIAGNOSIS — I10 Essential (primary) hypertension: Secondary | ICD-10-CM

## 2021-11-25 ENCOUNTER — Other Ambulatory Visit: Payer: Self-pay | Admitting: Physician Assistant

## 2021-11-25 DIAGNOSIS — I10 Essential (primary) hypertension: Secondary | ICD-10-CM

## 2021-11-25 MED ORDER — AMLODIPINE BESYLATE 10 MG PO TABS: 10.0000 mg | ORAL_TABLET | Freq: Every day | ORAL | 0 refills | Status: DC

## 2021-12-28 NOTE — Progress Notes (Deleted)
? ? ?Patient ID: Diana Hess, female    DOB: 06-Jun-1981  MRN: [id number] ? ?CC: Hypertension Follow-Up  ? ?Subjective: ?Diana Hess is a 41 y.o. female who presents for hypertension follow-up.  ? ?Her concerns today include:  ?HYPERTENSION FOLLOW-UP: ?12/12/2020: ?- Continue Amlodipine as prescribed.  ? ?01/01/2022: ? ? ?Patient Active Problem List  ? Diagnosis Date Noted  ? Other fatigue 03/22/2021  ? Bacterial vaginosis 03/22/2021  ? Iron deficiency anemia 12/13/2020  ? Vitamin D deficiency 04/02/2020  ? Essential hypertension 03/29/2020  ?  ? ?Current Outpatient Medications on File Prior to Visit  ?Medication Sig Dispense Refill  ? amLODipine (NORVASC) 10 MG tablet Take 1 tablet (10 mg total) by mouth daily. Must have OV for refills 30 tablet 0  ? ferrous sulfate 324 (65 Fe) MG TBEC Take 1 tablet (325 mg total) by mouth every other day. 90 tablet 0  ? ferumoxytol in sodium chloride 0.9 % 100 mL ordered dose: 510 mg; route: intravenous; frequency: once @ 468 mL/hr over 15 minutes; dispense: 1 each; admin instructions: Monitor patients carefully for allergic reactions during Feraheme administration and for at least 30 minutes following each infusion. Allow minimum of 30 minutes between Feraheme and admin of meds that could potentially cause hypersensitivity reactions, such as chemotherapeutic agents or monoclonal antibodies. after 3 to 8 days, administer a second dose of 510 mg once. 2 Dose 0  ? ondansetron (ZOFRAN ODT) 4 MG disintegrating tablet Take 1 tablet (4 mg total) by mouth every 8 (eight) hours as needed for nausea or vomiting. 10 tablet 0  ? Vitamin D, Ergocalciferol, (DRISDOL) 1.25 MG (50000 UNIT) CAPS capsule Take 1 capsule (50,000 Units total) by mouth every 7 (seven) days. 4 capsule 2  ? ?No current facility-administered medications on file prior to visit.  ? ? ?Allergies  ?Allergen Reactions  ? Lisinopril Cough  ? ? ?Social History  ? ?Socioeconomic History  ? Marital status: Single  ?  Spouse  name: Not on file  ? Number of children: Not on file  ? Years of education: Not on file  ? Highest education level: Not on file  ?Occupational History  ? Not on file  ?Tobacco Use  ? Smoking status: Every Day  ? Smokeless tobacco: Never  ?Substance and Sexual Activity  ? Alcohol use: Yes  ? Drug use: Never  ? Sexual activity: Not on file  ?Other Topics Concern  ? Not on file  ?Social History Narrative  ? Not on file  ? ?Social Determinants of Health  ? ?Financial Resource Strain: Not on file  ?Food Insecurity: Not on file  ?Transportation Needs: Not on file  ?Physical Activity: Not on file  ?Stress: Not on file  ?Social Connections: Not on file  ?Intimate Partner Violence: Not on file  ? ? ?Family History  ?Problem Relation Age of Onset  ? Healthy Mother   ? Healthy Father   ? ? ?Past Surgical History:  ?Procedure Laterality Date  ? CESAREAN SECTION    ? CESAREAN SECTION N/A   ? Phreesia 03/26/2020  ? ? ?ROS: ?Review of Systems ?Negative except as stated above ? ?PHYSICAL EXAM: ?There were no vitals taken for this visit. ? ?Physical Exam ? ?{female adult master:310786} ?{female adult master:310785} ? ? ?  Latest Ref Rng & Units 04/28/2021  ?  6:01 PM 03/21/2021  ?  3:33 PM 03/29/2020  ?  3:22 PM  ?CMP  ?Glucose 70 - 99 mg/dL 92   536  97    ?BUN 6 - 20 mg/dL 9   8   10     ?Creatinine 0.44 - 1.00 mg/dL   4.25   9.56    ?Sodium 135 - 145 mmol/L 136   137   137    ?Potassium 3.5 - 5.1 mmol/L 3.9   3.9   4.3    ?Chloride 98 - 111 mmol/L 105   102   103    ?CO2 22 - 32 mmol/L 22    22    ?Calcium 8.9 - 10.3 mg/dL 8.6   9.1   9.1    ?Total Protein 6.5 - 8.1 g/dL 7.1   7.3   7.4    ?Total Bilirubin 0.3 - 1.2 mg/dL 0.5   0.2   3.87    ?Alkaline Phos 38 - 126 U/L 54   70   74    ?AST 15 - 41 U/L 15   8   13     ?ALT 0 - 44 U/L 13    10    ? ?Lipid Panel  ?No results found for: CHOL, TRIG, HDL, CHOLHDL, VLDL, LDLCALC, LDLDIRECT ? ?CBC ?   ?Component Value Date/Time  ? WBC 6.4 04/28/2021 1801  ? RBC 4.16 04/28/2021 1801  ? HGB  10.6 (L) 04/28/2021 1801  ? HGB 11.3 03/21/2021 1533  ? HCT 33.9 (L) 04/28/2021 1801  ? HCT 35.3 03/21/2021 1533  ? PLT 438 (H) 04/28/2021 1801  ? PLT 453 (H) 03/21/2021 1533  ? MCV 81.5 04/28/2021 1801  ? MCV 83 03/21/2021 1533  ? MCH 25.5 (L) 04/28/2021 1801  ? MCHC 31.3 04/28/2021 1801  ? RDW 15.2 04/28/2021 1801  ? RDW 17.4 (H) 03/21/2021 1533  ? LYMPHSABS 1.4 04/28/2021 1801  ? LYMPHSABS 1.0 03/21/2021 1533  ? MONOABS 0.4 04/28/2021 1801  ? EOSABS 0.3 04/28/2021 1801  ? EOSABS 0.2 03/21/2021 1533  ? BASOSABS 0.1 04/28/2021 1801  ? BASOSABS 0.1 03/21/2021 1533  ? ? ?ASSESSMENT AND PLAN: ? ?There are no diagnoses linked to this encounter. ? ? ?Patient was given the opportunity to ask questions.  Patient verbalized understanding of the plan and was able to repeat key elements of the plan. Patient was given clear instructions to go to Emergency Department or return to medical center if symptoms don't improve, worsen, or new problems develop.The patient verbalized understanding. ? ? ?No orders of the defined types were placed in this encounter. ? ? ? ?Requested Prescriptions  ? ? No prescriptions requested or ordered in this encounter  ? ? ?No follow-ups on file. ? ?06/28/2021, NP  ?

## 2022-01-01 ENCOUNTER — Ambulatory Visit: Payer: BC Managed Care – PPO | Admitting: Family

## 2022-01-01 DIAGNOSIS — I10 Essential (primary) hypertension: Secondary | ICD-10-CM

## 2022-01-06 ENCOUNTER — Ambulatory Visit: Payer: Self-pay | Admitting: *Deleted

## 2022-01-06 ENCOUNTER — Other Ambulatory Visit: Payer: Self-pay | Admitting: Family

## 2022-01-06 DIAGNOSIS — I10 Essential (primary) hypertension: Secondary | ICD-10-CM

## 2022-01-06 NOTE — Telephone Encounter (Signed)
Medication Refill - Medication: amLODipine (NORVASC) 10 MG tablet [[id number]]  ? ?Has the patient contacted their pharmacy? No. ? ? ?Preferred Pharmacy (with phone number or street name):  ?[id number] - [place] ([place]), [place] - [address]  ?[address] [place] ([place]) [place] [address]  ?Phone: [phone] Fax: [phone]  ?Hours: Not open 24 hours  ? ? ?Has the patient been seen for an appointment in the last year OR does the patient have an upcoming appointment? Yes.   ? ?Agent: Please be advised that RX refills may take up to 3 business days. We ask that you follow-up with your pharmacy. ? ?

## 2022-01-06 NOTE — Telephone Encounter (Signed)
Pt is calling because she is having vaginal pain on the right side 7/10. She took a BC and the pain went away.  ? ?And pt having dental pain on the left side.  ? ?And pt has MS. And pt is not sure if she having relapses.   ?  ? ?Chief Complaint: Vaginal pain, Tooth pain ?Symptoms: Right sided vaginal pain, radiates to back, shooting pain,does not occur daily, does not occur during intercourse. "I take a BC and it goes away." Also toothache right side. States right side goes numb when I have the vaginal pain." ?Frequency: "Few times" ?Pertinent Negatives: Patient denies vaginal discharge ?Disposition: [] ED /[] Urgent Care (no appt availability in office) / [x] Appointment(In office/virtual)/ []  [facility] [facility]/ [] Home Care/ [] Refused Recommended Disposition /[] [place] [facility]/ []  Follow-up with PCP ?Additional Notes:  Offered earlier appt with Cari, declines. States "I will just keep my May appt." CAre advise given, CB for worsening symptoms. Verbalizes understanding.  ?Reason for Disposition ? All other vaginal symptoms  (Exception: feels like prior yeast infection, minor abrasion, mild rash < 24 hour duration, mild itching) ? ?Answer Assessment - Initial Assessment Questions ?1. SYMPTOM: "What's the main symptom you're concerned about?" (e.g., pain, itching, dryness) ?   Pain, right side of vagina ?2. LOCATION: "Where is the   located?" (e.g., inside/outside, left/right) ?    Inside ?3. ONSET: "When did the    start?" ?    2 weeks ago ?4. PAIN: "Is there any pain?" If Yes, ask: "How bad is it?" (Scale: 1-10; mild, moderate, severe) ?    varies ?5. ITCHING: "Is there any itching?" If Yes, ask: "How bad is it?" (Scale: 1-10; mild, moderate, severe) ?    no ?6. CAUSE: "What do you think is causing the discharge?" "Have you had the same problem before? What happened then?" ?    No discharge presently ?7. OTHER SYMPTOMS: "Do you have any other symptoms?" (e.g., fever, itching, vaginal bleeding, pain with  urination, injury to genital area, vaginal foreign body) ?    Tooth pain, left ? ?Protocols used: Vaginal Symptoms-A-AH ? ?

## 2022-01-07 ENCOUNTER — Other Ambulatory Visit: Payer: Self-pay

## 2022-01-07 MED ORDER — AMLODIPINE BESYLATE 10 MG PO TABS: 10.0000 mg | ORAL_TABLET | Freq: Every day | ORAL | 0 refills | Status: DC

## 2022-01-07 NOTE — Telephone Encounter (Signed)
Courtesy refill. Future visit in 2 weeks .  ?Requested Prescriptions  ?Pending Prescriptions Disp Refills  ?? amLODipine (NORVASC) 10 MG tablet 20 tablet 0  ?  Sig: Take 1 tablet (10 mg total) by mouth daily. Must have OV for refills  ?  ? Cardiovascular: Calcium Channel Blockers 2 Failed - 01/06/2022  5:27 PM  ?  ?  Failed - Valid encounter within last 6 months  ?  Recent Outpatient Visits   ?      ? 1 year ago Encounter to establish care  ? Primary Care at [facility], [place], NP  ? 1 year ago Essential hypertension  ? Primary Care at [facility], Dionne Bucy, PA-C  ? 1 year ago Encounter to establish care  ? Primary Care at [facility], Bayard Beaver, MD  ?  ?  ?Future Appointments   ?        ? In 2 weeks Camillia Herter, NP Primary Care at [facility]  ?  ? ?  ?  ?  Passed - Last BP in normal range  ?  BP Readings from Last 1 Encounters:  ?04/29/21 125/63  ?   ?  ?  Passed - Last Heart Rate in normal range  ?  Pulse Readings from Last 1 Encounters:  ?04/29/21 83  ?   ?  ?  ? ?
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
## 2022-01-27 ENCOUNTER — Encounter: Payer: Self-pay | Admitting: Family

## 2022-01-27 ENCOUNTER — Other Ambulatory Visit (HOSPITAL_COMMUNITY)
Admission: RE | Admit: 2022-01-27 | Discharge: 2022-01-27 | Disposition: A | Discharge status: Home / Self Care | Payer: Medicaid - Out of State | Source: Ambulatory Visit | Attending: Family | Admitting: Family

## 2022-01-27 ENCOUNTER — Ambulatory Visit (INDEPENDENT_AMBULATORY_CARE_PROVIDER_SITE_OTHER): Payer: Medicaid - Out of State | Admitting: Family

## 2022-01-27 VITALS — BP 136/78 | HR 73 | Temp 98.5°F | Resp 16 | Ht 65.98 in | Wt 240.0 lb

## 2022-01-27 DIAGNOSIS — E559 Vitamin D deficiency, unspecified: Secondary | ICD-10-CM

## 2022-01-27 DIAGNOSIS — R102 Pelvic and perineal pain: Secondary | ICD-10-CM | POA: Insufficient documentation

## 2022-01-27 DIAGNOSIS — R5383 Other fatigue: Secondary | ICD-10-CM

## 2022-01-27 DIAGNOSIS — S0993XS Unspecified injury of face, sequela: Secondary | ICD-10-CM

## 2022-01-27 DIAGNOSIS — M79672 Pain in left foot: Secondary | ICD-10-CM

## 2022-01-27 DIAGNOSIS — Z1231 Encounter for screening mammogram for malignant neoplasm of breast: Secondary | ICD-10-CM

## 2022-01-27 DIAGNOSIS — M79671 Pain in right foot: Secondary | ICD-10-CM

## 2022-01-27 DIAGNOSIS — I10 Essential (primary) hypertension: Secondary | ICD-10-CM

## 2022-01-27 LAB — POCT URINALYSIS DIP (CLINITEK)
Bilirubin, UA: NEGATIVE
Glucose, UA: NEGATIVE mg/dL
Ketones, POC UA: NEGATIVE mg/dL
Leukocytes, UA: NEGATIVE
Nitrite, UA: NEGATIVE
POC PROTEIN,UA: NEGATIVE
Spec Grav, UA: 1.02 (ref 1.010–1.025)
Urobilinogen, UA: 1 E.U./dL
pH, UA: 6 (ref 5.0–8.0)

## 2022-01-27 LAB — POCT URINE PREGNANCY: Preg Test, Ur: NEGATIVE

## 2022-01-27 MED ORDER — AMLODIPINE BESYLATE 10 MG PO TABS: 10.0000 mg | ORAL_TABLET | Freq: Every day | ORAL | 0 refills | Status: DC

## 2022-01-27 NOTE — Progress Notes (Signed)
No urinary tract infection.   Urine pregnancy negative.

## 2022-01-27 NOTE — Progress Notes (Signed)
Pt presents for hypertension f/u  ?Pt having complaints of pelvic pain that starts on the right side and moves up her back issue started about month ago states that [facility] powder suppress the pain  ?Experiencing extreme fatigue  ?

## 2022-01-28 ENCOUNTER — Telehealth: Payer: Self-pay | Admitting: Family

## 2022-01-28 ENCOUNTER — Encounter: Payer: Self-pay | Admitting: Family

## 2022-01-28 ENCOUNTER — Other Ambulatory Visit: Payer: Self-pay | Admitting: Family

## 2022-01-28 DIAGNOSIS — B3731 Acute candidiasis of vulva and vagina: Secondary | ICD-10-CM

## 2022-01-28 DIAGNOSIS — D509 Iron deficiency anemia, unspecified: Secondary | ICD-10-CM

## 2022-01-28 DIAGNOSIS — E559 Vitamin D deficiency, unspecified: Secondary | ICD-10-CM

## 2022-01-28 DIAGNOSIS — B9689 Other specified bacterial agents as the cause of diseases classified elsewhere: Secondary | ICD-10-CM

## 2022-01-28 DIAGNOSIS — D649 Anemia, unspecified: Secondary | ICD-10-CM

## 2022-01-28 DIAGNOSIS — D75839 Thrombocytosis, unspecified: Secondary | ICD-10-CM

## 2022-01-28 DIAGNOSIS — R7303 Prediabetes: Secondary | ICD-10-CM | POA: Insufficient documentation

## 2022-01-28 HISTORY — DX: Acute candidiasis of vulva and vagina: B37.31

## 2022-01-28 LAB — CMP14+EGFR
ALT: 8 IU/L (ref 0–32)
AST: 8 IU/L (ref 0–40)
Albumin/Globulin Ratio: 1.1 — ABNORMAL LOW (ref 1.2–2.2)
Albumin: 4.3 g/dL (ref 3.8–4.8)
Alkaline Phosphatase: 68 IU/L (ref 44–121)
BUN/Creatinine Ratio: 13 (ref 9–23)
BUN: 10 mg/dL (ref 6–24)
Bilirubin Total: <0.2 mg/dL (ref 0.0–1.2)
CO2: 19 mmol/L — ABNORMAL LOW (ref 20–29)
Calcium: 10.1 mg/dL (ref 8.7–10.2)
Chloride: 99 mmol/L (ref 96–106)
Creatinine, Ser: 0.75 mg/dL (ref 0.57–1.00)
Globulin, Total: 3.9 g/dL (ref 1.5–4.5)
Glucose: 105 mg/dL — ABNORMAL HIGH (ref 70–99)
Potassium: 4.2 mmol/L (ref 3.5–5.2)
Sodium: 134 mmol/L (ref 134–144)
Total Protein: 8.2 g/dL (ref 6.0–8.5)
eGFR: 103 mL/min/{1.73_m2} (ref >59)

## 2022-01-28 LAB — CERVICOVAGINAL ANCILLARY ONLY
Bacterial Vaginitis (gardnerella): POSITIVE — AB
Candida Glabrata: NEGATIVE
Candida Vaginitis: POSITIVE — AB
Chlamydia: NEGATIVE
Comment: NEGATIVE
Comment: NEGATIVE
Comment: NEGATIVE
Comment: NEGATIVE
Comment: NEGATIVE
Comment: NORMAL
Neisseria Gonorrhea: NEGATIVE
Trichomonas: NEGATIVE

## 2022-01-28 LAB — CBC
Hematocrit: 31.4 % — ABNORMAL LOW (ref 34.0–46.6)
Hemoglobin: 9.3 g/dL — ABNORMAL LOW (ref 11.1–15.9)
MCH: 20.4 pg — ABNORMAL LOW (ref 26.6–33.0)
MCHC: 29.6 g/dL — ABNORMAL LOW (ref 31.5–35.7)
MCV: 69 fL — ABNORMAL LOW (ref 79–97)
Platelets: 512 10*3/uL — ABNORMAL HIGH (ref 150–450)
RBC: 4.56 x10E6/uL (ref 3.77–5.28)
RDW: 18.5 % — ABNORMAL HIGH (ref 11.7–15.4)
WBC: 6.7 10*3/uL (ref 3.4–10.8)

## 2022-01-28 LAB — VITAMIN D 25 HYDROXY (VIT D DEFICIENCY, FRACTURES): Vit D, 25-Hydroxy: 10 ng/mL — ABNORMAL LOW (ref 30.0–100.0)

## 2022-01-28 LAB — HEMOGLOBIN A1C
Est. average glucose Bld gHb Est-mCnc: 120 mg/dL
Hgb A1c MFr Bld: 5.8 % — ABNORMAL HIGH (ref 4.8–5.6)

## 2022-01-28 LAB — TSH: TSH: 1.35 u[IU]/mL (ref 0.450–4.500)

## 2022-01-28 MED ORDER — METRONIDAZOLE 500 MG PO TABS: 500.0000 mg | ORAL_TABLET | Freq: Two times a day (BID) | ORAL | 0 refills | Status: DC

## 2022-01-28 MED ORDER — VITAMIN D (ERGOCALCIFEROL) 1.25 MG (50000 UNIT) PO CAPS: 50000.0000 [IU] | ORAL_CAPSULE | ORAL | 2 refills | Status: DC

## 2022-01-28 MED ORDER — FLUCONAZOLE 150 MG PO TABS: 150.0000 mg | ORAL_TABLET | Freq: Once | ORAL | 0 refills | Status: AC

## 2022-01-28 MED ORDER — FERROUS SULFATE 324 (65 FE) MG PO TBEC: 1.0000 | DELAYED_RELEASE_TABLET | ORAL | 0 refills | Status: DC

## 2022-01-28 NOTE — Progress Notes (Signed)
-   Kidney function and electrolytes normal.  ?- Liver function normal. ?- Thyroid function normal.  ? ?The following abnormalities are noted:   ?- Anemia did not improve since 9 months ago.  ?- Platelets higher than normal. Platelets check for blood clotting ability.  ?- Hemoglobin A1c is consistent with prediabetes. Practice healthy eating habits of fresh fruit and vegetables, lean baked meats such as chicken, fish, and [place]; limit breads, rice, pastas, and desserts; practice regular aerobic exercise (at least 150 minutes a week as tolerated). ?- Vitamin D lower than normal. ? ?All other values are normal, stable or within acceptable limits. ?Medication changes / Follow up labs / Other changes or recommendations:   ?- Continue Ferrous Sulfate for anemia. ?- Referral to Hematology Oncology for further evaluation and management of chronic anemia and increased platelets. Expect a call within 2 weeks with appointment details.  ?- Recheck prediabetes in 6 months.  ?- Prescribed vitamin D supplement. Recheck vitamin D levels in 12 weeks.  ? ?Rema Fendt, NP 01/28/2022 7:42 AM  ?

## 2022-01-28 NOTE — Progress Notes (Signed)
Chlamydia, Gonorrhea, Trichomonas negative.  ? ?Positive for Bacterial Vaginitis. Prescribed Metronidazole. Do not drink alcohol while taking this medication as this may cause severe nausea, vomiting, and upset stomach.  ? ?Positive for Candida Vaginitis which is better known as a yeast infection. Prescribed Fluconazole. ? ?

## 2022-01-28 NOTE — Telephone Encounter (Signed)
Copied from CRM [id number]. Topic: General - Call Back - No Documentation ?>> Jan 28, 2022  9:31 AM Randol Kern wrote: ?Reason for CRM:  ? ?Molly Maduro calling from the pharmacy is requesting a call back from the clinic. They do not have ferrous sulfate 324 (65 Fe) MG TBEC in stock. Please call back and advise  ?[id number] - [place] ([place]), [place] - [address] ?[address] [place] ([place]) [place] [address] ?Phone: [phone] Fax: [phone] ?

## 2022-01-29 ENCOUNTER — Other Ambulatory Visit: Payer: Self-pay

## 2022-01-29 DIAGNOSIS — E559 Vitamin D deficiency, unspecified: Secondary | ICD-10-CM

## 2022-01-29 DIAGNOSIS — D649 Anemia, unspecified: Secondary | ICD-10-CM

## 2022-01-29 DIAGNOSIS — B9689 Other specified bacterial agents as the cause of diseases classified elsewhere: Secondary | ICD-10-CM

## 2022-01-29 DIAGNOSIS — D509 Iron deficiency anemia, unspecified: Secondary | ICD-10-CM

## 2022-01-29 DIAGNOSIS — D75839 Thrombocytosis, unspecified: Secondary | ICD-10-CM

## 2022-01-29 DIAGNOSIS — I10 Essential (primary) hypertension: Secondary | ICD-10-CM

## 2022-01-29 MED ORDER — METRONIDAZOLE 500 MG PO TABS: 500.0000 mg | ORAL_TABLET | Freq: Two times a day (BID) | ORAL | 0 refills | Status: AC

## 2022-01-29 MED ORDER — VITAMIN D (ERGOCALCIFEROL) 1.25 MG (50000 UNIT) PO CAPS: 50000.0000 [IU] | ORAL_CAPSULE | ORAL | 2 refills | Status: AC

## 2022-01-29 MED ORDER — FERROUS SULFATE 324 (65 FE) MG PO TBEC: 1.0000 | DELAYED_RELEASE_TABLET | ORAL | 0 refills | Status: AC

## 2022-01-29 MED ORDER — AMLODIPINE BESYLATE 10 MG PO TABS: 10.0000 mg | ORAL_TABLET | Freq: Every day | ORAL | 0 refills | Status: DC

## 2022-02-28 IMAGING — DX DG NASAL BONES 3+V
3 series · 3 of 3 positions shown · non-contrast
Comparison: No prior.

CLINICAL DATA: Fall.  Nasal injury.

EXAM:
NASAL BONES - 3+ VIEW

[nasal bones (1 of 2)]
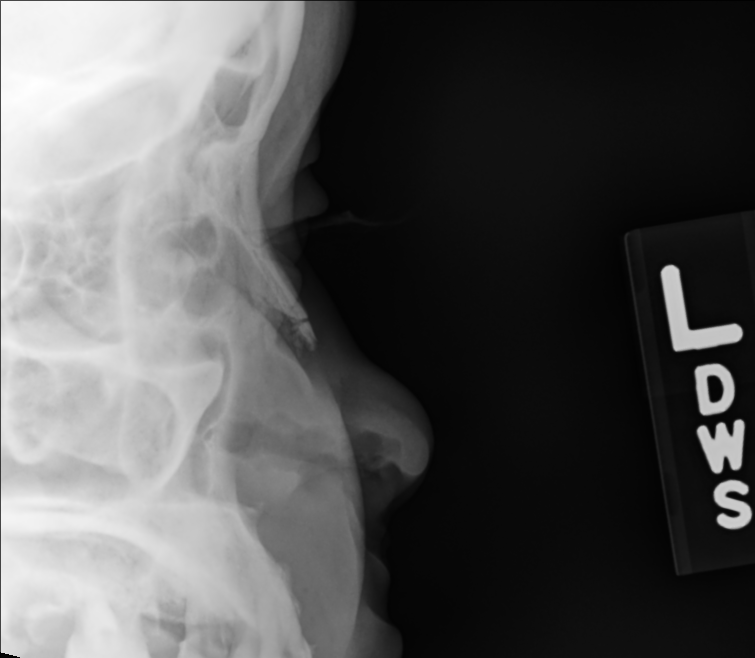

[nasal bones (2 of 2)]
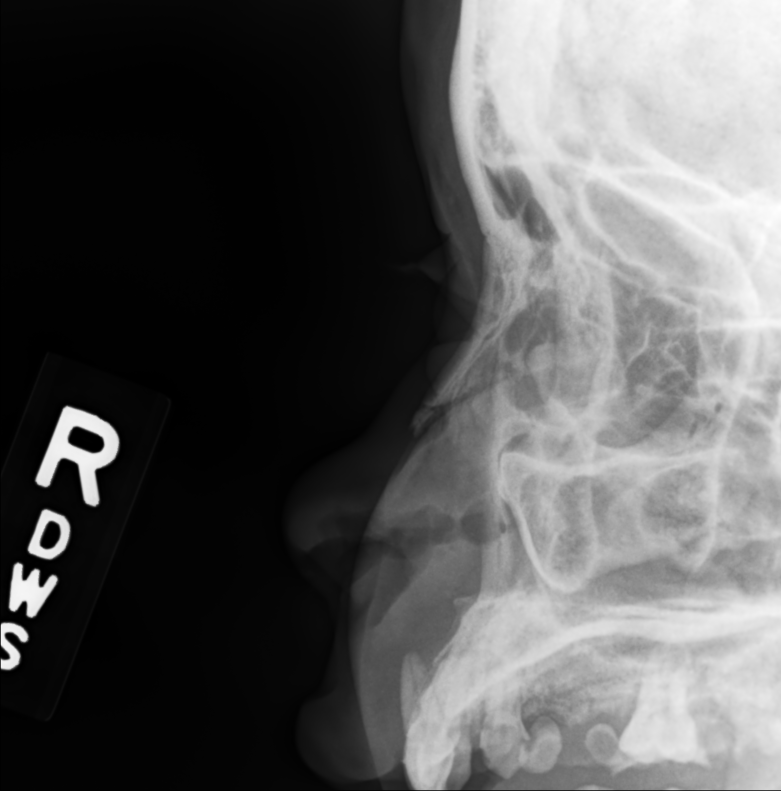

[skull pa]
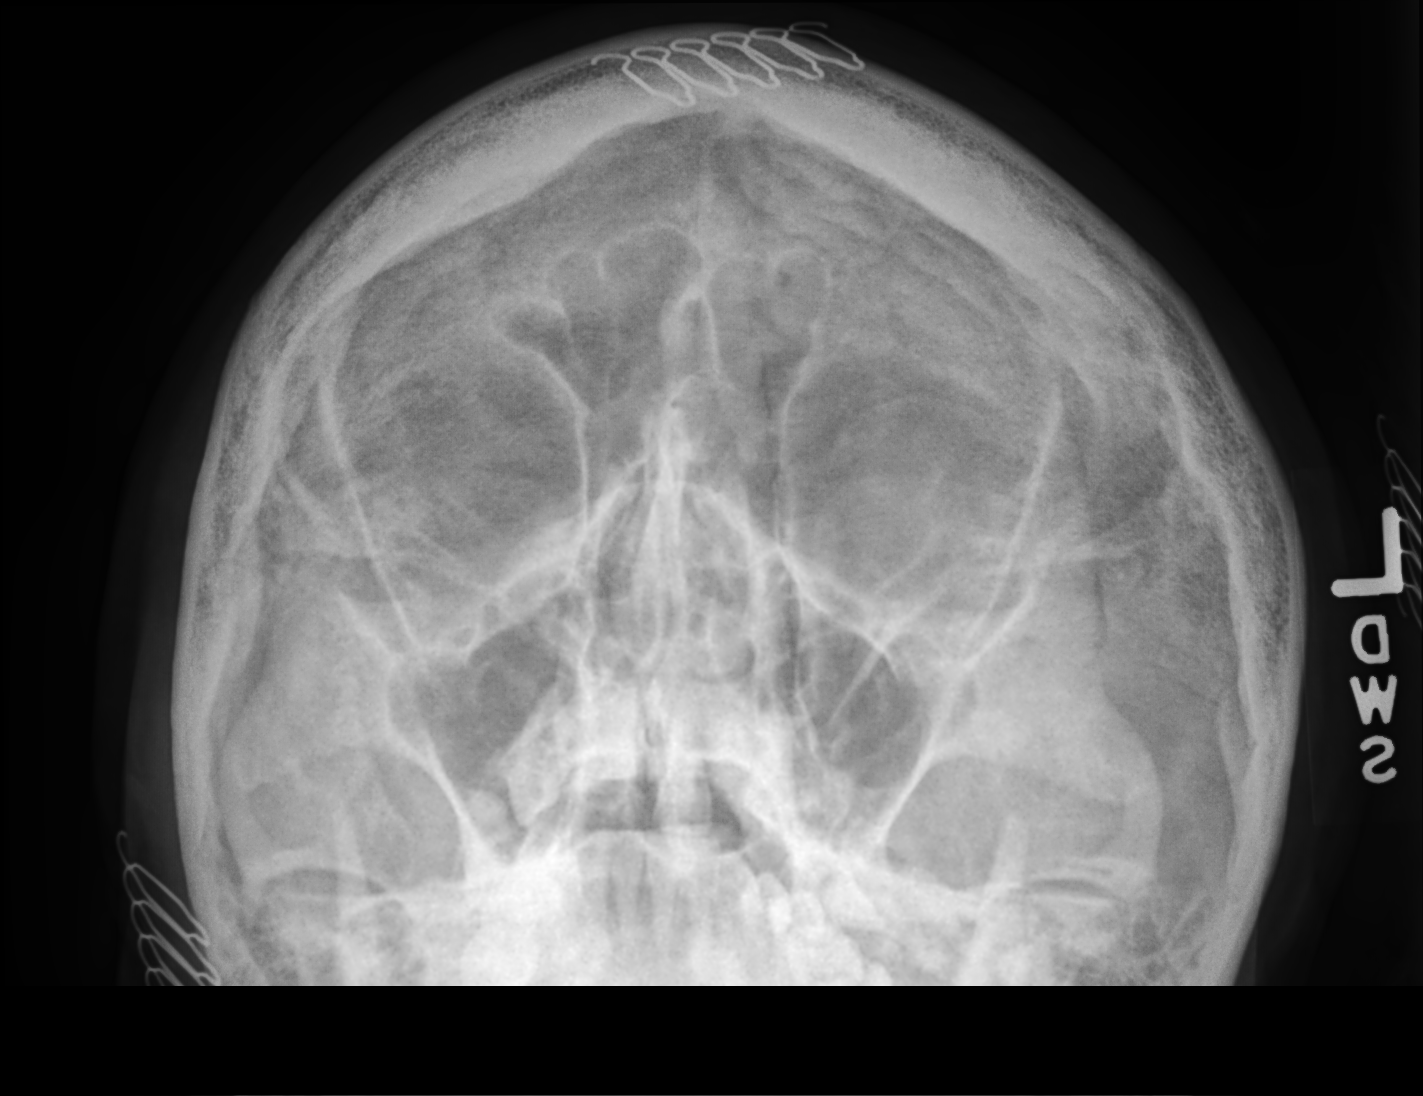

[3 of 3 positions shown; findings below may reference images not displayed]

FINDINGS: Nondisplaced nasal fractures present. Slightly displaced fracture of
the maxillary spine appears to be present. Paranasal sinuses are
clear. Orbits appear unremarkable. Zygomas appear unremarkable.
Visualized mastoids are clear.
IMPRESSION: Nondisplaced nasal fractures. Slightly displaced fracture of the
maxillary spine appears to be present.

## 2022-05-19 NOTE — Progress Notes (Signed)
Erroneous encounter-disregard

## 2022-05-30 ENCOUNTER — Encounter: Payer: Medicaid - Out of State | Admitting: Family

## 2022-05-30 DIAGNOSIS — I10 Essential (primary) hypertension: Secondary | ICD-10-CM

## 2022-05-30 DIAGNOSIS — Z13220 Encounter for screening for lipoid disorders: Secondary | ICD-10-CM

## 2022-05-30 DIAGNOSIS — Z0000 Encounter for general adult medical examination without abnormal findings: Secondary | ICD-10-CM

## 2022-06-13 ENCOUNTER — Other Ambulatory Visit: Payer: Self-pay

## 2022-06-13 DIAGNOSIS — I10 Essential (primary) hypertension: Secondary | ICD-10-CM

## 2022-06-13 MED ORDER — AMLODIPINE BESYLATE 10 MG PO TABS: 10.0000 mg | ORAL_TABLET | Freq: Every day | ORAL | 0 refills | Status: DC

## 2022-06-26 ENCOUNTER — Other Ambulatory Visit: Payer: Self-pay

## 2022-09-23 ENCOUNTER — Ambulatory Visit: Payer: Self-pay

## 2022-09-23 NOTE — Telephone Encounter (Signed)
  Chief Complaint: dizziness Symptoms: dizziness feeling weak and dehydrated Frequency: yesterday Pertinent Negatives: NA Disposition: [] ED /[x] Urgent Care (no appt availability in office) / [] Appointment(In office/virtual)/ []  [facility]/ [] Home Care/ [] Refused Recommended Disposition /[] [facility]/ []  Follow-up with PCP Additional Notes: pt states she was headed to PCP practice to get worked in, advised pt no appts today and recommended she can go to UC beside office. Pt verbalized understanding.   Reason for Disposition  [1] MODERATE dizziness (e.g., interferes with normal activities) AND [2] has NOT been evaluated by doctor (or NP/PA) for this  (Exception: Dizziness caused by heat exposure, sudden standing, or poor fluid intake.)  Answer Assessment - Initial Assessment Questions 2. LIGHTHEADED: "Do you feel lightheaded?" (e.g., somewhat faint, woozy, weak upon standing)     Feeling weak and like passing out  3. VERTIGO: "Do you feel like either you or the room is spinning or tilting?" (i.e. vertigo)     no 4. SEVERITY: "How bad is it?"  "Do you feel like you are going to faint?" "Can you stand and walk?"   - MILD: Feels slightly dizzy, but walking normally.   - MODERATE: Feels unsteady when walking, but not falling; interferes with normal activities (e.g., school, work).   - SEVERE: Unable to walk without falling, or requires assistance to walk without falling; feels like passing out now.      Moderate  5. ONSET:  "When did the dizziness begin?"     Yesterday  6. AGGRAVATING FACTORS: "Does anything make it worse?" (e.g., standing, change in head position)     Sitting and moving  10. OTHER SYMPTOMS: "Do you have any other symptoms?" (e.g., fever, chest pain, vomiting, diarrhea, bleeding)       Feeling dehydrated  Protocols used: Dizziness - Lightheadedness-A-AH

## 2022-09-24 NOTE — Telephone Encounter (Signed)
Pcp aware

## 2022-11-08 ENCOUNTER — Encounter (HOSPITAL_COMMUNITY): Payer: Self-pay | Admitting: *Deleted

## 2022-11-08 ENCOUNTER — Other Ambulatory Visit: Payer: Self-pay

## 2022-11-08 ENCOUNTER — Ambulatory Visit (HOSPITAL_COMMUNITY)
Admission: EM | Admit: 2022-11-08 | Discharge: 2022-11-08 | Disposition: A | Discharge status: Home / Self Care | Payer: Medicaid - Out of State | Attending: Emergency Medicine | Admitting: Emergency Medicine

## 2022-11-08 DIAGNOSIS — M62830 Muscle spasm of back: Secondary | ICD-10-CM

## 2022-11-08 MED ORDER — METHYLPREDNISOLONE SODIUM SUCC 125 MG IJ SOLR
60.0000 mg | Freq: Once | INTRAMUSCULAR | Status: AC
  Administered 2022-11-08: 60 mg via INTRAMUSCULAR

## 2022-11-08 MED ORDER — KETOROLAC TROMETHAMINE 60 MG/2ML IM SOLN
60.0000 mg | Freq: Once | INTRAMUSCULAR | Status: AC
  Administered 2022-11-08: 60 mg via INTRAMUSCULAR

## 2022-11-08 MED ORDER — PREDNISONE 20 MG PO TABS: 40.0000 mg | ORAL_TABLET | Freq: Every day | ORAL | 0 refills | Status: AC

## 2022-11-08 MED ORDER — NAPROXEN 500 MG PO TABS: 500.0000 mg | ORAL_TABLET | Freq: Two times a day (BID) | ORAL | 0 refills | Status: AC

## 2022-11-08 MED ORDER — METHYLPREDNISOLONE SODIUM SUCC 125 MG IJ SOLR
INTRAMUSCULAR | Status: AC
  Filled 2022-11-08: qty 2

## 2022-11-08 MED ORDER — METHOCARBAMOL 500 MG PO TABS: 500.0000 mg | ORAL_TABLET | Freq: Two times a day (BID) | ORAL | 0 refills | Status: DC

## 2022-11-08 MED ORDER — KETOROLAC TROMETHAMINE 60 MG/2ML IM SOLN
INTRAMUSCULAR | Status: AC
  Filled 2022-11-08: qty 2

## 2022-11-08 NOTE — Discharge Instructions (Addendum)
You appear to be having a muscle spasm.  We have given you a shot of Toradol which is a stronger anti-inflammatory in clinic as well as a steroid injection.  I am going to start you on a muscle relaxer twice daily as well as a strong anti-inflammatory, naproxen, 500 mg twice daily.  Please also start on the prednisone twice daily for the next 7 days.  Please follow-up with your primary care next week and you can follow-up with [facility] if your pain persist.  If your pain worsens, you develop numbness or tingling, loss of bowel or bladder function, please go immediately to the emergency room.

## 2022-11-08 NOTE — ED Triage Notes (Addendum)
Pt has had back pain for 3 weeks. PT wants pain meds before leaving tonight.

## 2022-11-08 NOTE — ED Provider Notes (Signed)
[place]    CSN: [id number] Arrival date & time: 11/08/22  1749      History   Chief Complaint Chief Complaint  Patient presents with   Back Pain    HPI Diana Hess is a 42 y.o. female.   Upon presentation patient is tearful and doubled over with right-sided lower back pain.  She reports a recent diagnosis of MS and thinks this could be an MS flare.  Reports she has been helping her boyfriend and was lifting him about 3 weeks ago.  Feels like her back is locked up.  Denies any numbness, tingling or incontinence.  Requesting pain medication in clinic.  The history is provided by the patient.  Back Pain Associated symptoms: no chest pain, no dysuria and no numbness     Past Medical History:  Diagnosis Date   Candida vaginitis 01/28/2022   Hypertension    Multiple sclerosis ([place])     Patient Active Problem List   Diagnosis Date Noted   Prediabetes 01/28/2022   Candida vaginitis 01/28/2022   Other fatigue 03/22/2021   Bacterial vaginosis 03/22/2021   Iron deficiency anemia 12/13/2020   Vitamin D deficiency 04/02/2020   Essential hypertension 03/29/2020    Past Surgical History:  Procedure Laterality Date   CESAREAN SECTION     CESAREAN SECTION N/A    [employer]reesia 03/26/2020    OB History   No obstetric history on file.      Home Medications    Prior to Admission medications   Medication Sig Start Date End Date Taking? Authorizing Provider  methocarbamol (ROBAXIN) 500 MG tablet Take 1 tablet (500 mg total) by mouth 2 (two) times daily. 11/08/22  Yes Louretta Shorten, [place], FNP  naproxen (NAPROSYN) 500 MG tablet Take 1 tablet (500 mg total) by mouth 2 (two) times daily. 11/08/22  Yes Louretta Shorten, [place], FNP  predniSONE (DELTASONE) 20 MG tablet Take 2 tablets (40 mg total) by mouth daily for 7 days. 11/08/22 11/15/22 Yes Louretta Shorten, [place], FNP  amLODipine (NORVASC) 10 MG tablet Take 1 tablet (10 mg total) by mouth daily. 06/13/22 09/11/22  Camillia Herter, NP  ferrous sulfate 324 (65 Fe) MG TBEC Take 1 tablet (325 mg total) by mouth every other day. 01/29/22   Camillia Herter, NP  ferumoxytol in sodium chloride 0.9 % 100 mL ordered dose: 510 mg; route: intravenous; frequency: once @ 468 mL/hr over 15 minutes; dispense: 1 each; admin instructions: Monitor patients carefully for allergic reactions during Feraheme administration and for at least 30 minutes following each infusion. Allow minimum of 30 minutes between Feraheme and admin of meds that could potentially cause hypersensitivity reactions, such as chemotherapeutic agents or monoclonal antibodies. after 3 to 8 days, administer a second dose of 510 mg once. 12/14/20   Camillia Herter, NP  ondansetron (ZOFRAN ODT) 4 MG disintegrating tablet Take 1 tablet (4 mg total) by mouth every 8 (eight) hours as needed for nausea or vomiting. 04/29/21   Charlann Lange, PA-C    Family History Family History  Problem Relation Age of Onset   Healthy Mother    Healthy Father     Social History Social History   Tobacco Use   Smoking status: Every Day   Smokeless tobacco: Never  Substance Use Topics   Alcohol use: Yes   Drug use: Never     Allergies   Lisinopril   Review of Systems Review of Systems  Constitutional:  Negative for chills, diaphoresis and  fatigue.  Respiratory:  Negative for shortness of breath.   Cardiovascular:  Negative for chest pain.  Genitourinary:  Negative for dysuria.  Musculoskeletal:  Positive for back pain and gait problem. Negative for joint swelling.  Neurological:  Negative for syncope and numbness.     Physical Exam Triage Vital Signs ED Triage Vitals  Enc Vitals Group     BP 11/08/22 1833 107/69     Pulse Rate 11/08/22 1833 85     Resp 11/08/22 1833 20     Temp 11/08/22 1833 98 F (36.7 C)     Temp src --      SpO2 11/08/22 1833 99 %     Weight --      Height --      Head Circumference --      Peak Flow --      Pain Score 11/08/22 1829 9     Pain  Loc --      Pain Edu? --      Excl. in [place]? --    No data found.  Updated Vital Signs BP 107/69   Pulse 85   Temp 98 F (36.7 C)   Resp 20   LMP 11/08/2022   SpO2 99%   Visual Acuity Right Eye Distance:   Left Eye Distance:   Bilateral Distance:    Right Eye Near:   Left Eye Near:    Bilateral Near:     Physical Exam Vitals and nursing note reviewed.  Constitutional:      Appearance: Normal appearance.     Comments: 42 year old female who appears stated age.  HENT:     Head: Normocephalic and atraumatic.     Right Ear: External ear normal.     Left Ear: External ear normal.     Nose: Nose normal.  Cardiovascular:     Rate and Rhythm: Normal rate and regular rhythm.     Pulses: Normal pulses.     Heart sounds: Normal heart sounds, S1 normal and S2 normal.  Pulmonary:     Effort: Pulmonary effort is normal.     Breath sounds: Normal breath sounds.     Comments: Lungs vesicular posteriorly. Musculoskeletal:        General: Tenderness present. Normal range of motion.     Cervical back: Normal range of motion.       Back:     Comments: Left-sided thoracic and lumbar tenderness to palpation.  Patient is doubled over and not fully cooperative with physical exam. Tearful. Ambulatory.   Skin:    General: Skin is warm and dry.  Neurological:     Mental Status: She is alert.  Psychiatric:        Behavior: Behavior is cooperative.      UC Treatments / Results  Labs (all labs ordered are listed, but only abnormal results are displayed) Labs Reviewed - No data to display  EKG   Radiology No results found.  Procedures Procedures (including critical care time)  Medications Ordered in UC Medications  ketorolac (TORADOL) injection 60 mg (60 mg Intramuscular Given 11/08/22 1901)  methylPREDNISolone sodium succinate (SOLU-MEDROL) 125 mg/2 mL injection 60 mg (60 mg Intramuscular Given 11/08/22 1902)    Initial Impression / Assessment and Plan / UC Course  I have  reviewed the triage vital signs and the nursing notes.  Pertinent labs & imaging results that were available during my care of the patient were reviewed by me and considered in my medical decision making (see  chart for details).  Vital signs and nursing note reviewed.  Patient is hemodynamically stable.  Reports a history of MS, do not see this documented.  Denies any traumas or falls.  Spine without step-off or deformity.  Diffuse left-sided thoracic and lumbar tenderness.  Ambulatory in clinic.  Denies any red flag symptoms like numbness, tingling, bowel or bladder incontinence.  Given a dose of IM steroids and Toradol in clinic, reports currently on menses and has had her tubes tied.  Sent home with muscle relaxers, anti-inflammatories and steroids.  Strict ER precautions given, patient verbalized understanding.    Final Clinical Impressions(s) / UC Diagnoses   Final diagnoses:  Muscle spasm of back     Discharge Instructions      You appear to be having a muscle spasm.  We have given you a shot of Toradol which is a stronger anti-inflammatory in clinic as well as a steroid injection.  I am going to start you on a muscle relaxer twice daily as well as a strong anti-inflammatory, naproxen, 500 mg twice daily.  Please also start on the prednisone twice daily for the next 7 days.  Please follow-up with your primary care next week and you can follow-up with [facility] if your pain persist.  If your pain worsens, you develop numbness or tingling, loss of bowel or bladder function, please go immediately to the emergency room.    ED Prescriptions     Medication Sig Dispense Auth. Provider   methocarbamol (ROBAXIN) 500 MG tablet Take 1 tablet (500 mg total) by mouth 2 (two) times daily. 20 tablet Louretta Shorten, [place], [place]   predniSONE (DELTASONE) 20 MG tablet Take 2 tablets (40 mg total) by mouth daily for 7 days. 14 tablet Louretta Shorten, [place], [place]   naproxen (NAPROSYN) 500 MG  tablet Take 1 tablet (500 mg total) by mouth 2 (two) times daily. 30 tablet Maitland Muhlbauer, [place] N, [place]      I have reviewed the PDMP during this encounter.   Graylon Amory, [place] N, [place] 11/08/22 [phone]

## 2022-12-15 NOTE — Progress Notes (Unsigned)
Patient ID: Diana Hess, female    DOB: 19-Jul-1981  MRN: [id number]  CC: Annual Physical Exam  Subjective: Diana Hess is a 42 y.o. female who presents for annual physical exam.   Her concerns today include:  - Doing well on blood pressure medication, no issues/concerns. She denies red flag symptoms such as but not limited to chest pain, shortness of breath, worst headache of life, nausea/vomiting.  - Reports she recently missed her period and would like a pregnancy test.  - Would like referral to Neurology for management of multiple sclerosis. Reports the same is also affecting her ambulation.  - Requests referral to Dermatology for management of facial acne.  - No further issues/concerns for discussion today.    Patient Active Problem List   Diagnosis Date Noted   Prediabetes 01/28/2022   Candida vaginitis 01/28/2022   Other fatigue 03/22/2021   Bacterial vaginosis 03/22/2021   Iron deficiency anemia 12/13/2020   Vitamin D deficiency 04/02/2020   Essential hypertension 03/29/2020     Current Outpatient Medications on File Prior to Visit  Medication Sig Dispense Refill   ferrous sulfate 324 (65 Fe) MG TBEC Take 1 tablet (325 mg total) by mouth every other day. 90 tablet 0   ferumoxytol in sodium chloride 0.9 % 100 mL ordered dose: 510 mg; route: intravenous; frequency: once @ 468 mL/hr over 15 minutes; dispense: 1 each; admin instructions: Monitor patients carefully for allergic reactions during Feraheme administration and for at least 30 minutes following each infusion. Allow minimum of 30 minutes between Feraheme and admin of meds that could potentially cause hypersensitivity reactions, such as chemotherapeutic agents or monoclonal antibodies. after 3 to 8 days, administer a second dose of 510 mg once. 2 Dose 0   methocarbamol (ROBAXIN) 500 MG tablet Take 1 tablet (500 mg total) by mouth 2 (two) times daily. 20 tablet 0   naproxen (NAPROSYN) 500 MG tablet Take 1 tablet  (500 mg total) by mouth 2 (two) times daily. 30 tablet 0   ondansetron (ZOFRAN ODT) 4 MG disintegrating tablet Take 1 tablet (4 mg total) by mouth every 8 (eight) hours as needed for nausea or vomiting. 10 tablet 0   No current facility-administered medications on file prior to visit.    Allergies  Allergen Reactions   Lisinopril Cough    Social History   Socioeconomic History   Marital status: Single    Spouse name: Not on file   Number of children: Not on file   Years of education: Not on file   Highest education level: Not on file  Occupational History   Not on file  Tobacco Use   Smoking status: Every Day    Passive exposure: Current   Smokeless tobacco: Never  Substance and Sexual Activity   Alcohol use: Yes   Drug use: Never   Sexual activity: Not on file  Other Topics Concern   Not on file  Social History Narrative   Not on file   Social Determinants of Health   Financial Resource Strain: Not on file  Food Insecurity: Not on file  Transportation Needs: Not on file  Physical Activity: Not on file  Stress: Not on file  Social Connections: Not on file  Intimate Partner Violence: Not on file    Family History  Problem Relation Age of Onset   Healthy Mother    Healthy Father     Past Surgical History:  Procedure Laterality Date   CESAREAN SECTION  CESAREAN SECTION N/A    Phreesia 03/26/2020    ROS: Review of Systems Negative except as stated above  PHYSICAL EXAM: BP 130/63 (BP Location: Left Arm, Patient Position: Sitting, Cuff Size: Large)   Pulse 96   Temp 98.3 F (36.8 C)   Resp 16   Ht 5' 5.98" (1.676 m)   Wt 240 lb (108.9 kg)   SpO2 97%   BMI 38.76 kg/m   Physical Exam HENT:     Head: Normocephalic and atraumatic.     Right Ear: Tympanic membrane, ear canal and external ear normal.     Left Ear: Tympanic membrane, ear canal and external ear normal.     Nose: Nose normal.     Mouth/Throat:     Mouth: Mucous membranes are moist.      Pharynx: Oropharynx is clear.  Eyes:     Extraocular Movements: Extraocular movements intact.     Conjunctiva/sclera: Conjunctivae normal.     Pupils: Pupils are equal, round, and reactive to light.  Cardiovascular:     Rate and Rhythm: Normal rate and regular rhythm.     Pulses: Normal pulses.     Heart sounds: Normal heart sounds.  Pulmonary:     Effort: Pulmonary effort is normal.     Breath sounds: Normal breath sounds.  Chest:  Breasts:    Right: Normal.     Left: Normal.     Comments: Elmon Else, CMA present.  Abdominal:     General: Bowel sounds are normal.     Palpations: Abdomen is soft.  Genitourinary:    General: Normal vulva.     Vagina: Normal.     Cervix: Normal.     Uterus: Normal.      Adnexa: Right adnexa normal and left adnexa normal.     Comments: Elmon Else, CMA present.  Musculoskeletal:        General: Normal range of motion.     Right shoulder: Normal.     Left shoulder: Normal.     Right upper arm: Normal.     Left upper arm: Normal.     Right elbow: Normal.     Left elbow: Normal.     Right forearm: Normal.     Left forearm: Normal.     Right wrist: Normal.     Left wrist: Normal.     Right hand: Normal.     Left hand: Normal.     Cervical back: Normal, normal range of motion and neck supple.     Thoracic back: Normal.     Lumbar back: Normal.     Right hip: Normal.     Left hip: Normal.     Right upper leg: Normal.     Left upper leg: Normal.     Right knee: Normal.     Left knee: Normal.     Right lower leg: Normal.     Left lower leg: Normal.     Right ankle: Normal.     Left ankle: Normal.     Right foot: Normal.     Left foot: Normal.  Skin:    General: Skin is warm and dry.     Capillary Refill: Capillary refill takes less than 2 seconds.  Neurological:     General: No focal deficit present.     Mental Status: She is alert and oriented to person, place, and time.  Psychiatric:        Mood and Affect: Mood  normal.  Behavior: Behavior normal.     ASSESSMENT AND PLAN: Please note - all orders associated with today's encounter were entered separately from note due to patient arrived after appointment time.   Annual physical exam - Counseled on 150 minutes of exercise per week as tolerated, healthy eating (including decreased daily intake of saturated fats, cholesterol, added sugars, sodium), STI prevention, and routine healthcare maintenance.  2. Metabolic screening - Routine CMP.  3. Anemia screening  - Routine CBC.   4. Diabetes screening  - Routine hemoglobin A1c.   5. Cholesterol screening  - Routine lipid panel   6. Thyroid screening  - Routine TSH.   7. Breast cancer screening - Routine mammogram.   8. Cervical cancer screening - Routine cytology pap.   9. Routine STD screening - Routine cervicovaginal ancillary.   10. Missed period - Routine serum hCG.   11. Primary hypertension  - Continue Amlodipine as prescribed.  - Counseled on blood pressure goal of less than 130/80, low-sodium, DASH diet, medication compliance, and 150 minutes of moderate intensity exercise per week as tolerated. Counseled on medication adherence and adverse effects. - Follow-up with primary provider in 6 months or sooner if needed.   12. Multiple sclerosis - Referral to Neurology for further evaluation/management.   13. Acne vulgaris - Referral to Dermatology for further evaluation/management.    Patient was given the opportunity to ask questions.  Patient verbalized understanding of the plan and was able to repeat key elements of the plan. Patient was given clear instructions to go to Emergency Department or return to medical center if symptoms don't improve, worsen, or new problems develop.The patient verbalized understanding.   Camillia Herter, NP

## 2022-12-17 ENCOUNTER — Other Ambulatory Visit: Payer: Self-pay

## 2022-12-17 ENCOUNTER — Other Ambulatory Visit: Payer: Self-pay | Admitting: Family

## 2022-12-17 ENCOUNTER — Other Ambulatory Visit (HOSPITAL_COMMUNITY)
Admission: RE | Admit: 2022-12-17 | Discharge: 2022-12-17 | Disposition: A | Discharge status: Home / Self Care | Payer: BLUE CROSS/BLUE SHIELD | Source: Ambulatory Visit | Attending: Family | Admitting: Family

## 2022-12-17 ENCOUNTER — Encounter: Payer: Self-pay | Admitting: Family

## 2022-12-17 DIAGNOSIS — Z13228 Encounter for screening for other metabolic disorders: Secondary | ICD-10-CM

## 2022-12-17 DIAGNOSIS — Z113 Encounter for screening for infections with a predominantly sexual mode of transmission: Secondary | ICD-10-CM

## 2022-12-17 DIAGNOSIS — Z124 Encounter for screening for malignant neoplasm of cervix: Secondary | ICD-10-CM | POA: Diagnosis present

## 2022-12-17 DIAGNOSIS — Z130 Encounter for screening for diseases of the blood and blood-forming organs and certain disorders involving the immune mechanism: Secondary | ICD-10-CM

## 2022-12-17 DIAGNOSIS — Z0000 Encounter for general adult medical examination without abnormal findings: Secondary | ICD-10-CM

## 2022-12-17 DIAGNOSIS — Z13220 Encounter for screening for lipoid disorders: Secondary | ICD-10-CM

## 2022-12-17 DIAGNOSIS — G35 Multiple sclerosis: Secondary | ICD-10-CM

## 2022-12-17 DIAGNOSIS — Z1329 Encounter for screening for other suspected endocrine disorder: Secondary | ICD-10-CM

## 2022-12-17 DIAGNOSIS — I10 Essential (primary) hypertension: Secondary | ICD-10-CM

## 2022-12-17 DIAGNOSIS — Z1231 Encounter for screening mammogram for malignant neoplasm of breast: Secondary | ICD-10-CM

## 2022-12-17 DIAGNOSIS — Z3200 Encounter for pregnancy test, result unknown: Secondary | ICD-10-CM

## 2022-12-17 DIAGNOSIS — Z131 Encounter for screening for diabetes mellitus: Secondary | ICD-10-CM

## 2022-12-17 DIAGNOSIS — L700 Acne vulgaris: Secondary | ICD-10-CM

## 2022-12-17 DIAGNOSIS — R7303 Prediabetes: Secondary | ICD-10-CM

## 2022-12-17 MED ORDER — AMLODIPINE BESYLATE 10 MG PO TABS: 10.0000 mg | ORAL_TABLET | Freq: Every day | ORAL | 0 refills | Status: DC

## 2022-12-17 NOTE — Progress Notes (Addendum)
.  Pt presents for annual physical exam w/pap   -pt needs referral for multiple sclerosis  -request refills on methocarbamol, amlodipine,and naproxen

## 2022-12-18 ENCOUNTER — Ambulatory Visit: Payer: Self-pay | Admitting: *Deleted

## 2022-12-18 LAB — CERVICOVAGINAL ANCILLARY ONLY
Bacterial Vaginitis (gardnerella): POSITIVE — AB
Candida Glabrata: NEGATIVE
Candida Vaginitis: NEGATIVE
Chlamydia: NEGATIVE
Comment: NEGATIVE
Comment: NEGATIVE
Comment: NEGATIVE
Comment: NEGATIVE
Comment: NEGATIVE
Comment: NORMAL
Neisseria Gonorrhea: NEGATIVE
Trichomonas: NEGATIVE

## 2022-12-18 LAB — CBC
Hematocrit: 23 % — ABNORMAL LOW (ref 34.0–46.6)
Hemoglobin: 5.8 g/dL — CL (ref 11.1–15.9)
MCH: 16.1 pg — ABNORMAL LOW (ref 26.6–33.0)
MCHC: 25.2 g/dL — ABNORMAL LOW (ref 31.5–35.7)
MCV: 64 fL — ABNORMAL LOW (ref 79–97)
Platelets: 822 10*3/uL (ref 150–450)
RBC: 3.61 x10E6/uL — ABNORMAL LOW (ref 3.77–5.28)
RDW: 18.9 % — ABNORMAL HIGH (ref 11.7–15.4)
WBC: 7.2 10*3/uL (ref 3.4–10.8)

## 2022-12-18 LAB — CMP14+EGFR
ALT: 7 IU/L (ref 0–32)
AST: 10 IU/L (ref 0–40)
Albumin/Globulin Ratio: 1.4 (ref 1.2–2.2)
Albumin: 4.3 g/dL (ref 3.9–4.9)
Alkaline Phosphatase: 66 IU/L (ref 44–121)
BUN/Creatinine Ratio: 11 (ref 9–23)
BUN: 8 mg/dL (ref 6–24)
Bilirubin Total: <0.2 mg/dL (ref 0.0–1.2)
CO2: 21 mmol/L (ref 20–29)
Calcium: 9.1 mg/dL (ref 8.7–10.2)
Chloride: 107 mmol/L — ABNORMAL HIGH (ref 96–106)
Creatinine, Ser: 0.76 mg/dL (ref 0.57–1.00)
Globulin, Total: 3.1 g/dL (ref 1.5–4.5)
Glucose: 108 mg/dL — ABNORMAL HIGH (ref 70–99)
Potassium: 4.2 mmol/L (ref 3.5–5.2)
Sodium: 144 mmol/L (ref 134–144)
Total Protein: 7.4 g/dL (ref 6.0–8.5)
eGFR: 100 mL/min/{1.73_m2} (ref >59)

## 2022-12-18 LAB — LIPID PANEL
Chol/HDL Ratio: 3.1 ratio (ref 0.0–4.4)
Cholesterol, Total: 150 mg/dL (ref 100–199)
HDL: 49 mg/dL (ref >39)
LDL Chol Calc (NIH): 81 mg/dL (ref 0–99)
Triglycerides: 111 mg/dL (ref 0–149)
VLDL Cholesterol Cal: 20 mg/dL (ref 5–40)

## 2022-12-18 LAB — TSH: TSH: 0.467 u[IU]/mL (ref 0.450–4.500)

## 2022-12-18 LAB — HEMOGLOBIN A1C
Est. average glucose Bld gHb Est-mCnc: 114 mg/dL
Hgb A1c MFr Bld: 5.6 % (ref 4.8–5.6)

## 2022-12-18 NOTE — Telephone Encounter (Signed)
  Chief Complaint: [facility] called in critical lab result.   Hemoglobin 5.8 Symptoms: N/A Frequency: N/A Pertinent Negatives: Patient denies N/A Disposition: [] ED /[] Urgent Care (no appt availability in office) / [] Appointment(In office/virtual)/ []  [facility]/ [] Home Care/ [] Refused Recommended Disposition /[] [facility]/ [x]  Follow-up with PCP Additional Notes: I called into Primary Care at [facility] without an answer to the flow coordinator. [facility] is faxing this result to [employer].   I sent a Teams message to Evelena Asa, RN with this result for Durene Fruits, NP

## 2022-12-18 NOTE — Telephone Encounter (Signed)
Call received for [facility] alert [place] that "' "critical lab result from [facility] for one of [name].  MRN [id number]  Diana, Hess  Hemoglobin 5.8  [facility] has faxed this to Primary Care at [facility] too." Lester Kinsman aware and  will alert Provider.

## 2022-12-18 NOTE — Telephone Encounter (Signed)
Reason for Disposition  Health Information question, no triage required and triager able to answer question  Answer Assessment - Initial Assessment Questions 1. REASON FOR CALL or QUESTION: "What is your reason for calling today?" or "How can I best help you?" or "What question do you have that I can help answer?"     [facility] calling in.   Patience with [facility] calling in a critical lab of hemoglobin 5.8 She is faxing this result to Primary Care at [facility] also.  Protocols used: Information Only Call - No Triage-A-AH

## 2022-12-22 ENCOUNTER — Telehealth: Payer: Self-pay | Admitting: *Deleted

## 2022-12-22 ENCOUNTER — Other Ambulatory Visit: Payer: Self-pay | Admitting: Family

## 2022-12-22 ENCOUNTER — Encounter: Payer: Self-pay | Admitting: Neurology

## 2022-12-22 DIAGNOSIS — M62830 Muscle spasm of back: Secondary | ICD-10-CM

## 2022-12-22 DIAGNOSIS — N760 Acute vaginitis: Secondary | ICD-10-CM

## 2022-12-22 DIAGNOSIS — R87610 Atypical squamous cells of undetermined significance on cytologic smear of cervix (ASC-US): Secondary | ICD-10-CM

## 2022-12-22 LAB — CYTOLOGY - PAP
Adequacy: ABSENT
Comment: NEGATIVE
Diagnosis: UNDETERMINED — AB
High risk HPV: NEGATIVE

## 2022-12-22 MED ORDER — METRONIDAZOLE 500 MG PO TABS: 500.0000 mg | ORAL_TABLET | Freq: Two times a day (BID) | ORAL | 0 refills | Status: AC

## 2022-12-22 MED ORDER — METHOCARBAMOL 500 MG PO TABS: 500.0000 mg | ORAL_TABLET | Freq: Two times a day (BID) | ORAL | 0 refills | Status: AC

## 2022-12-22 NOTE — Telephone Encounter (Signed)
Patient Care called to set up appt for patient  transfusion of blood. I spoke with Caryl Pina and was given 12/25/2022 @0800 . Caryl Pina will fax over information ( physicians order) to be sign and faxed back.  Patient has been called and is aware of all information.

## 2022-12-22 NOTE — Telephone Encounter (Signed)
Noted  

## 2022-12-22 NOTE — Telephone Encounter (Signed)
Copied from [facility] [phone] [phone]. Topic: General - Other >> Dec 22, 2022 10:10 AM Everette C wrote: Reason for CRM: The patient has called to share that they have been seen recently and discussed medications with their PCP   The patient shares that they anticipated being prescribed multiple medications which appear to not be filled  The patient would like to discuss further when possible   Pt needs Metronidazole or appropriate medication for the positive BV results from 12/18/22. Pt also requesting naproxen and methocarbamol be refilled

## 2022-12-22 NOTE — Telephone Encounter (Signed)
Metronidazole and Methocarbamol orders complete. Hold Naproxen as of present due to patient's anemia and pending blood transfusion.

## 2022-12-22 NOTE — Telephone Encounter (Signed)
Spoke to pt aware of medication being sent into pharmacy.

## 2022-12-22 NOTE — Telephone Encounter (Signed)
Patient was called and identified. Patient was given lab results and aware we will set her up for transfusion.

## 2022-12-24 ENCOUNTER — Other Ambulatory Visit: Payer: Self-pay | Admitting: Family Medicine

## 2022-12-25 ENCOUNTER — Non-Acute Institutional Stay (HOSPITAL_COMMUNITY)
Admission: RE | Admit: 2022-12-25 | Discharge: 2022-12-25 | Disposition: A | Discharge status: Home / Self Care | Payer: BLUE CROSS/BLUE SHIELD | Source: Ambulatory Visit | Attending: Internal Medicine | Admitting: Internal Medicine

## 2022-12-25 VITALS — BP 118/77 | HR 81 | Temp 98.0°F | Resp 16

## 2022-12-25 DIAGNOSIS — D649 Anemia, unspecified: Secondary | ICD-10-CM | POA: Insufficient documentation

## 2022-12-25 LAB — ABO/RH: ABO/RH(D): A POS

## 2022-12-25 LAB — PREPARE RBC (CROSSMATCH)

## 2022-12-25 MED ORDER — SODIUM CHLORIDE 0.9% IV SOLUTION: Freq: Once | INTRAVENOUS | Status: AC

## 2022-12-25 NOTE — Progress Notes (Signed)
PATIENT CARE CENTER NOTE  Diagnosis: Unspecified Anemia    Provider: Dorna Mai MD  Procedure: Blood transfusion  Note: Pt received 1 unit PRBCS via PIV. No pre medications per orders. Consent obtained and in pt chart. Pt tolerated transfusion with no adverse reaction. AVS printed and given to pt. Pt is alert, oriented, and ambulatory at discharge.

## 2022-12-26 LAB — BPAM RBC
Blood Product Expiration Date: 202404252359
ISSUE DATE / TIME: 202404040957
Unit Type and Rh: 6200

## 2022-12-26 LAB — TYPE AND SCREEN
ABO/RH(D): A POS
Antibody Screen: NEGATIVE
Unit division: 0

## 2023-01-12 ENCOUNTER — Ambulatory Visit: Payer: BLUE CROSS/BLUE SHIELD | Admitting: Dermatology

## 2023-02-02 ENCOUNTER — Ambulatory Visit (HOSPITAL_COMMUNITY)
Admission: EM | Admit: 2023-02-02 | Discharge: 2023-02-02 | Disposition: A | Discharge status: Home / Self Care | Payer: BLUE CROSS/BLUE SHIELD | Attending: Family | Admitting: Family

## 2023-02-02 ENCOUNTER — Encounter (HOSPITAL_COMMUNITY): Payer: Self-pay

## 2023-02-02 DIAGNOSIS — D509 Iron deficiency anemia, unspecified: Secondary | ICD-10-CM | POA: Insufficient documentation

## 2023-02-02 DIAGNOSIS — R5383 Other fatigue: Secondary | ICD-10-CM | POA: Diagnosis present

## 2023-02-02 DIAGNOSIS — N939 Abnormal uterine and vaginal bleeding, unspecified: Secondary | ICD-10-CM | POA: Insufficient documentation

## 2023-02-02 DIAGNOSIS — N946 Dysmenorrhea, unspecified: Secondary | ICD-10-CM | POA: Insufficient documentation

## 2023-02-02 HISTORY — DX: Anemia, unspecified: D64.9

## 2023-02-02 LAB — CBC WITH DIFFERENTIAL/PLATELET
Abs Immature Granulocytes: 0.02 10*3/uL (ref 0.00–0.07)
Basophils Absolute: 0.1 10*3/uL (ref 0.0–0.1)
Basophils Relative: 2 %
Eosinophils Absolute: 0.7 10*3/uL — ABNORMAL HIGH (ref 0.0–0.5)
Eosinophils Relative: 9 %
HCT: 26.2 % — ABNORMAL LOW (ref 36.0–46.0)
Hemoglobin: 7.1 g/dL — ABNORMAL LOW (ref 12.0–15.0)
Immature Granulocytes: 0 %
Lymphocytes Relative: 28 %
Lymphs Abs: 2.1 10*3/uL (ref 0.7–4.0)
MCH: 18.6 pg — ABNORMAL LOW (ref 26.0–34.0)
MCHC: 27.1 g/dL — ABNORMAL LOW (ref 30.0–36.0)
MCV: 68.6 fL — ABNORMAL LOW (ref 80.0–100.0)
Monocytes Absolute: 0.5 10*3/uL (ref 0.1–1.0)
Monocytes Relative: 7 %
Neutro Abs: 4 10*3/uL (ref 1.7–7.7)
Neutrophils Relative %: 54 %
Platelets: 434 10*3/uL — ABNORMAL HIGH (ref 150–400)
RBC: 3.82 MIL/uL — ABNORMAL LOW (ref 3.87–5.11)
RDW: 24.2 % — ABNORMAL HIGH (ref 11.5–15.5)
WBC: 7.5 10*3/uL (ref 4.0–10.5)
nRBC: 0 % (ref 0.0–0.2)

## 2023-02-02 LAB — POCT URINE PREGNANCY: Preg Test, Ur: NEGATIVE

## 2023-02-02 NOTE — ED Triage Notes (Addendum)
Pt c/o severe menstrual cramps with associated n/v x2 weeks. Period began 5/4 and has been heavier and longer than usual. Pt has hx of anemia; required blood transfusion last month.  Pt also requests prescription reordered for Robaxin. Reports she was unable to fill the recent prescription.

## 2023-02-02 NOTE — ED Provider Notes (Signed)
[facility]    CSN: [id number] Arrival date & time: 02/02/23  1155      History   Chief Complaint Chief Complaint  Patient presents with   Abdominal Cramping   Emesis   Medication Refill    HPI Diana Hess is a 42 y.o. female.   42 year old female presents with heavy, prolonged vaginal bleeding for the past 10 days. She indicates her period/vaginal bleeding has never been this heavy and has never lasted this long before. Period started on 5/4 and she is having to change pads every couple of hours. She did not go see anyone until now because she was concerned she would bleed through her clothes. She is also having uterine cramping which is causing her to be nauseous and she has vomited today. She is not currently on any hormones or birth control. She has not been sexually active for about 2 months. She did see her PCP on 12/17/22 for her annual exam and had a papsmear, blood work and STD testing done. She was positive for BV and treated with Flagyl. Her CBC  showed critical Hgb level and elevated platelets and she was [facility] transfusion that occurred on 12/25/22. No additional blood work or follow-up has been scheduled since except she was referred to GYN but no particular practice listed for further evaluation. She has been diagnosed with iron deficiency anemia in the past and placed on daily oral iron but she has difficulty taking it due to nausea/vomiting. Other chronic health issues include HTN which is controlled by Norvasc and Multiple sclerosis- was on Naproxen and Robaxin but told to hold these medications when she had her blood transfusion. No other follow-up scheduled except with her Neurologist in July 2024.   The history is provided by the patient.    Past Medical History:  Diagnosis Date   Anemia    Candida vaginitis 01/28/2022   Hypertension    Multiple sclerosis [facility])     Patient Active Problem List   Diagnosis Date Noted    Prediabetes 01/28/2022   Candida vaginitis 01/28/2022   Other fatigue 03/22/2021   BV (bacterial vaginosis) 03/22/2021   Iron deficiency anemia 12/13/2020   Vitamin D deficiency 04/02/2020   Essential hypertension 03/29/2020    Past Surgical History:  Procedure Laterality Date   CESAREAN SECTION     CESAREAN SECTION N/A    Phreesia 03/26/2020    OB History   No obstetric history on file.      Home Medications    Prior to Admission medications   Medication Sig Start Date End Date Taking? Authorizing Provider  amLODipine (NORVASC) 10 MG tablet Take 1 tablet (10 mg total) by mouth daily. 12/17/22 03/17/23 Yes Zonia Kief, Amy J, NP  ferrous sulfate 324 (65 Fe) MG TBEC Take 1 tablet (325 mg total) by mouth every other day. 01/29/22   Rema Fendt, NP  ferumoxytol in sodium chloride 0.9 % 100 mL ordered dose: 510 mg; route: intravenous; frequency: once @ 468 mL/hr over 15 minutes; dispense: 1 each; admin instructions: Monitor patients carefully for allergic reactions during Feraheme administration and for at least 30 minutes following each infusion. Allow minimum of 30 minutes between Feraheme and admin of meds that could potentially cause hypersensitivity reactions, such as chemotherapeutic agents or monoclonal antibodies. after 3 to 8 days, administer a second dose of 510 mg once. 12/14/20   Rema Fendt, NP  methocarbamol (ROBAXIN) 500 MG tablet Take 1  tablet (500 mg total) by mouth 2 (two) times daily. 12/22/22   Rema Fendt, NP  naproxen (NAPROSYN) 500 MG tablet Take 1 tablet (500 mg total) by mouth 2 (two) times daily. 11/08/22   Garrison, [place] N, FNP  ondansetron (ZOFRAN ODT) 4 MG disintegrating tablet Take 1 tablet (4 mg total) by mouth every 8 (eight) hours as needed for nausea or vomiting. 04/29/21   Elpidio Anis, PA-C    Family History Family History  Problem Relation Age of Onset   Healthy Mother    Healthy Father     Social History Social History   Tobacco Use    Smoking status: Every Day    Passive exposure: Current   Smokeless tobacco: Never  Vaping Use   Vaping Use: Never used  Substance Use Topics   Alcohol use: Yes   Drug use: Never     Allergies   Lisinopril   Review of Systems Review of Systems  Constitutional:  Positive for activity change, appetite change and fatigue. Negative for chills, diaphoresis and fever.  HENT:  Negative for mouth sores, sore throat and trouble swallowing.   Respiratory:  Negative for cough, chest tightness and shortness of breath.   Gastrointestinal:  Positive for nausea and vomiting. Negative for blood in stool.  Genitourinary:  Positive for menstrual problem, pelvic pain and vaginal bleeding. Negative for decreased urine volume, difficulty urinating, dysuria, flank pain, frequency, genital sores, hematuria and vaginal discharge.  Musculoskeletal:  Positive for arthralgias, back pain and myalgias.  Skin:  Negative for color change and rash.  Allergic/Immunologic: Negative for environmental allergies and food allergies.  Neurological:  Positive for weakness and light-headedness. Negative for tremors, seizures, syncope, facial asymmetry, speech difficulty and numbness.  Hematological:  Negative for adenopathy. Bruises/bleeds easily.     Physical Exam Triage Vital Signs ED Triage Vitals  Enc Vitals Group     BP 02/02/23 1328 134/82     Pulse Rate 02/02/23 1328 70     Resp 02/02/23 1328 18     Temp 02/02/23 1328 97.8 F (36.6 C)     Temp Source 02/02/23 1328 Oral     SpO2 02/02/23 1328 99 %     Weight --      Height --      Head Circumference --      Peak Flow --      Pain Score 02/02/23 1324 7     Pain Loc --      Pain Edu? --      Excl. in GC? --    No data found.  Updated Vital Signs BP 134/82 (BP Location: Right Arm)   Pulse 70   Temp 97.8 F (36.6 C) (Oral)   Resp 18   LMP 01/24/2023 (Exact Date)   SpO2 99%   Visual Acuity Right Eye Distance:   Left Eye Distance:    Bilateral Distance:    Right Eye Near:   Left Eye Near:    Bilateral Near:     Physical Exam Vitals and nursing note reviewed.  Constitutional:      General: She is awake. She is not in acute distress.    Appearance: She is well-developed and well-groomed.     Comments: She is sitting on the exam table in no acute distress but appears uncomfortable due to pelvic cramping and also experiences some right sided back muscle spasms when changing positions during exam.   HENT:     Head: Normocephalic and atraumatic.  Right Ear: Hearing normal.     Left Ear: Hearing normal.  Eyes:     Extraocular Movements: Extraocular movements intact.     Conjunctiva/sclera: Conjunctivae normal.  Cardiovascular:     Rate and Rhythm: Normal rate and regular rhythm.     Heart sounds: Normal heart sounds. No murmur heard. Pulmonary:     Effort: Pulmonary effort is normal. No tachypnea, accessory muscle usage, respiratory distress or retractions.     Breath sounds: Normal breath sounds and air entry. No decreased air movement. No decreased breath sounds, wheezing, rhonchi or rales.  Abdominal:     General: Abdomen is flat. Bowel sounds are normal. There is no distension or abdominal bruit.     Palpations: Abdomen is soft.     Tenderness: There is abdominal tenderness in the right lower quadrant, suprapubic area and left lower quadrant. There is no right CVA tenderness, left CVA tenderness, guarding or rebound.    Genitourinary:    Comments: Patient refused internal pelvic exam. Again- discussed importance of performing exam to determine extent of bleeding but patient refused.  Musculoskeletal:     Cervical back: Normal range of motion and neck supple.  Skin:    General: Skin is warm and dry.     Capillary Refill: Capillary refill takes less than 2 seconds.     Findings: No rash.  Neurological:     General: No focal deficit present.     Mental Status: She is alert and oriented to person, place,  and time.  Psychiatric:        Attention and Perception: Attention normal.        Mood and Affect: Mood is anxious.        Speech: Speech normal.        Behavior: Behavior is cooperative.        Thought Content: Thought content normal.      UC Treatments / Results  Labs (all labs ordered are listed, but only abnormal results are displayed) Labs Reviewed  CBC WITH DIFFERENTIAL/PLATELET  POCT URINE PREGNANCY    EKG   Radiology No results found.  Procedures Procedures (including critical care time)  Medications Ordered in UC Medications - No data to display  Initial Impression / Assessment and Plan / UC Course  I have reviewed the triage vital signs and the nursing notes.  Pertinent labs & imaging results that were available during my care of the patient were reviewed by me and considered in my medical decision making (see chart for details).     Reviewed with patient uncertain cause of abnormal vaginal bleeding- she will need further evaluation with probable vaginal ultrasound and additional testing. Discussed that we do not perform those tests at Urgent Care but provided her with information on [facility] in [place]. Prefer patient go there now but patient declined and will call them today for an appointment.  Urine pregnancy test is negative. Did not repeat any STD testing at this time since patient was negative less than 2 months ago and no change in partner.  Note written for work for today and tomorrow.  Concern over anemia and etiology- and recent heavy vaginal bleeding making the anemia worse with possible need for another transfusion. Do not take any Naproxen which can increase bleeding. Will draw a CBC today to run STAT and notify patient of results later today. Discussed with her that if her Hgb, RBC level and HCT are similar or lower than previous results, she will need  to go to the ER ASAP for further evaluation. Otherwise, follow-up with [facility] ASAP and with her PCP as planned.   Final Clinical Impressions(s) / UC Diagnoses   Final diagnoses:  Excessive vaginal bleeding  Abnormal vaginal bleeding  Iron deficiency anemia, unspecified iron deficiency anemia type  Other fatigue  Menstrual cramps     Discharge Instructions      Recommend call [facility] this afternoon to schedule appointment for further evaluation of heavy, prolonged  vaginal bleeding- may need vaginal ultrasound and other evaluation. We rechecked your blood for anemia today- if your levels are as low or lower than what they were 1 month ago, you will need to go to the ER ASAP for further evaluation and treatment which may include another blood transfusion. Follow-up pending lab results.     ED Prescriptions   None    PDMP not reviewed this encounter.   Sudie Grumbling, NP 02/02/23 [phone]

## 2023-02-02 NOTE — Discharge Instructions (Signed)
Recommend call [facility] this afternoon to schedule appointment for further evaluation of heavy, prolonged  vaginal bleeding- may need vaginal ultrasound and other evaluation. We rechecked your blood for anemia today- if your levels are as low or lower than what they were 1 month ago, you will need to go to the ER ASAP for further evaluation and treatment which may include another blood transfusion. Follow-up pending lab results.

## 2023-02-03 NOTE — Telephone Encounter (Signed)
Please contact patient for rescheduling

## 2023-03-02 ENCOUNTER — Other Ambulatory Visit: Payer: Self-pay | Admitting: Obstetrics & Gynecology

## 2023-03-02 ENCOUNTER — Ambulatory Visit: Payer: BLUE CROSS/BLUE SHIELD | Admitting: Obstetrics & Gynecology

## 2023-03-02 DIAGNOSIS — R87610 Atypical squamous cells of undetermined significance on cytologic smear of cervix (ASC-US): Secondary | ICD-10-CM

## 2023-03-02 NOTE — Progress Notes (Signed)
Pap result 12/17/22 reviewed. ASCUS negative HR HPV is a benign result and f/u is routine repeat in 3 years. She was notified and she voiced no other concerns requiring office visit today. V. [place] spoke to the patient.  Adam Phenix, MD

## 2023-03-10 ENCOUNTER — Other Ambulatory Visit: Payer: Self-pay | Admitting: Family

## 2023-03-10 DIAGNOSIS — I10 Essential (primary) hypertension: Secondary | ICD-10-CM

## 2023-03-10 NOTE — Telephone Encounter (Signed)
Requested Prescriptions  Pending Prescriptions Disp Refills   amLODipine (NORVASC) 10 MG tablet [Pharmacy Med Name: AMLODIPINE BESYLATE 10 MG TAB] 90 tablet 0    Sig: TAKE 1 TABLET BY MOUTH EVERY DAY     Cardiovascular: Calcium Channel Blockers 2 Passed - 03/10/2023 10:30 AM      Passed - Last BP in normal range    BP Readings from Last 1 Encounters:  02/02/23 134/82         Passed - Last Heart Rate in normal range    Pulse Readings from Last 1 Encounters:  02/02/23 70         Passed - Valid encounter within last 6 months    Recent Outpatient Visits           1 year ago Essential (primary) hypertension   [place] [facility], [place], [place]   2 years ago Encounter to establish care   [facility] at [facility], Amy J, NP   2 years ago Essential hypertension   [place] [facility], Marzella Schlein, [place]   2 years ago Encounter to establish care   [facility] at [facility], Kandee Keen, MD       Future Appointments             In 1 month Drema [place], DO [facility]   In 1 month Terri Piedra, DO [facility]

## 2023-04-15 ENCOUNTER — Telehealth: Payer: Self-pay | Admitting: *Deleted

## 2023-04-15 NOTE — Telephone Encounter (Signed)
Please advise patient.  

## 2023-04-16 NOTE — Telephone Encounter (Signed)
This is in regards to her finding a new PCP. I have advised her if she calls the office when she finds one, we can get their fax number and fax them her records.

## 2023-04-22 ENCOUNTER — Ambulatory Visit: Payer: Medicaid Other | Admitting: Neurology

## 2023-05-04 ENCOUNTER — Ambulatory Visit: Payer: BLUE CROSS/BLUE SHIELD | Admitting: Dermatology

## 2023-06-16 ENCOUNTER — Other Ambulatory Visit: Payer: Self-pay | Admitting: Family

## 2023-06-16 DIAGNOSIS — I10 Essential (primary) hypertension: Secondary | ICD-10-CM

## 2023-06-16 NOTE — Telephone Encounter (Signed)
Medication Refill - Medication: amLODipine (NORVASC) 10 MG tablet , naproxen (NAPROSYN) 500 MG tablet   Has the patient contacted their pharmacy? No.   Preferred Pharmacy (with phone number or street name):  [id number] [place], [place] - [address] AT [facility] OF [address] Phone: [phone]  Fax: [phone]      Has the patient been seen for an appointment in the last year OR does the patient have an upcoming appointment? Yes.    The patient states she has been out of her bp medicine since last week. Please assist patient further

## 2023-06-17 NOTE — Telephone Encounter (Signed)
Called pt, let her know that she is due for OV for medication refill. Pt states she has moved to [place] now and unable to come in. Pt states she has been 1 week without amlodipine and she has been feeling dizziness and HA. Pt is asking if Amy can refill medication without visit so she doesn't die d/t feeling bad. Advised her I would send message to provider and nurse can FU with decision.   Requested Prescriptions  Pending Prescriptions Disp Refills   amLODipine (NORVASC) 10 MG tablet 90 tablet 0    Sig: Take 1 tablet (10 mg total) by mouth daily.     Cardiovascular: Calcium Channel Blockers 2 Failed - 06/16/2023  4:25 PM      Failed - Valid encounter within last 6 months    Recent Outpatient Visits           1 year ago Essential (primary) hypertension   [place] [facility], [place], [place]   2 years ago Encounter to establish care   [place] [facility] at [facility], [place], [place]   2 years ago Essential hypertension   [place] [facility], Marzella Schlein, PA-C   3 years ago Encounter to establish care   [facility] at [facility], Kandee Keen, MD              Passed - Last BP in normal range    BP Readings from Last 1 Encounters:  02/02/23 134/82         Passed - Last Heart Rate in normal range    Pulse Readings from Last 1 Encounters:  02/02/23 70          naproxen (NAPROSYN) 500 MG tablet 30 tablet 0    Sig: Take 1 tablet (500 mg total) by mouth 2 (two) times daily.     Analgesics:  NSAIDS Failed - 06/16/2023  4:25 PM      Failed - Manual Review: Labs are only required if the patient has taken medication for more than 8 weeks.      Failed - HGB in normal range and within 360 days    Hemoglobin  Date Value Ref Range Status  02/02/2023 7.1 (L) 12.0 - 15.0 g/dL Final    Comment:    Reticulocyte Hemoglobin testing may be clinically indicated, consider ordering this  additional test [id number]   12/17/2022 5.8 (LL) 11.1 - 15.9 g/dL Final    Comment:    **CBC Results Repeated**         Failed - PLT in normal range and within 360 days    Platelets  Date Value Ref Range Status  02/02/2023 434 (H) 150 - 400 K/uL Final    Comment:    REPEATED TO VERIFY  12/17/2022 822 (HH) 150 - 450 x10E3/uL Final         Failed - HCT in normal range and within 360 days    HCT  Date Value Ref Range Status  02/02/2023 26.2 (L) 36.0 - 46.0 % Final   Hematocrit  Date Value Ref Range Status  12/17/2022 23.0 (L) 34.0 - 46.6 % Final         Passed - Cr in normal range and within 360 days    Creatinine, Ser  Date Value Ref Range Status  12/17/2022 0.76 0.57 - 1.00 mg/dL Final         Passed - eGFR is 30  or above and within 360 days    GFR calc Af Amer  Date Value Ref Range Status  03/29/2020 96 >59 mL/min/1.73 Final    Comment:    **[facility] currently reports eGFR in compliance with the current**   recommendations of the [employer]. [facility] will   update reporting as new guidelines are published from the NKF-ASN   Task force.    GFR, Estimated  Date Value Ref Range Status  04/28/2021 >60 >60 mL/min Final    Comment:    (NOTE) Calculated using the CKD-EPI Creatinine Equation (2021)    eGFR  Date Value Ref Range Status  12/17/2022 100 >59 mL/min/1.73 Final         Passed - Patient is not pregnant      Passed - Valid encounter within last 12 months    Recent Outpatient Visits           1 year ago Essential (primary) hypertension   [place] [facility], [place], [place]   2 years ago Encounter to establish care   [facility] at [facility], Amy J, NP   2 years ago Essential hypertension   [place] [facility] at [facility], Marzella Schlein, [place]   3 years ago Encounter to establish care   [facility] at [facility], Kandee Keen, MD

## 2023-06-17 NOTE — Telephone Encounter (Signed)
Requested medication (s) are due for refill today: yes  Requested medication (s) are on the active medication list: yes  Last refill:  amlodipine 03/10/23 #90/0, naproxen 11/08/22 #30/0  Future visit scheduled: no, pt has moved to [place]  Notes to clinic:  pt asking for refill on amlodipine for than anything. Can do VV if absolutely necessary. Please advise      Requested Prescriptions  Pending Prescriptions Disp Refills   amLODipine (NORVASC) 10 MG tablet 90 tablet 0    Sig: Take 1 tablet (10 mg total) by mouth daily.     Cardiovascular: Calcium Channel Blockers 2 Failed - 06/16/2023  4:25 PM      Failed - Valid encounter within last 6 months    Recent Outpatient Visits           1 year ago Essential (primary) hypertension   [place] [facility], [place], [place]   2 years ago Encounter to establish care   [place] [facility] at [facility], [place], NP   2 years ago Essential hypertension   [place] [facility] at [facility], Marzella Schlein, PA-C   3 years ago Encounter to establish care   [facility] at [facility], Kandee Keen, MD              Passed - Last BP in normal range    BP Readings from Last 1 Encounters:  02/02/23 134/82         Passed - Last Heart Rate in normal range    Pulse Readings from Last 1 Encounters:  02/02/23 70          naproxen (NAPROSYN) 500 MG tablet 30 tablet 0    Sig: Take 1 tablet (500 mg total) by mouth 2 (two) times daily.     Analgesics:  NSAIDS Failed - 06/16/2023  4:25 PM      Failed - Manual Review: Labs are only required if the patient has taken medication for more than 8 weeks.      Failed - HGB in normal range and within 360 days    Hemoglobin  Date Value Ref Range Status  02/02/2023 7.1 (L) 12.0 - 15.0 g/dL Final    Comment:    Reticulocyte Hemoglobin testing may be clinically indicated, consider ordering this additional test [id number]    12/17/2022 5.8 (LL) 11.1 - 15.9 g/dL Final    Comment:    **CBC Results Repeated**         Failed - PLT in normal range and within 360 days    Platelets  Date Value Ref Range Status  02/02/2023 434 (H) 150 - 400 K/uL Final    Comment:    REPEATED TO VERIFY  12/17/2022 822 (HH) 150 - 450 x10E3/uL Final         Failed - HCT in normal range and within 360 days    HCT  Date Value Ref Range Status  02/02/2023 26.2 (L) 36.0 - 46.0 % Final   Hematocrit  Date Value Ref Range Status  12/17/2022 23.0 (L) 34.0 - 46.6 % Final         Passed - Cr in normal range and within 360 days    Creatinine, Ser  Date Value Ref Range Status  12/17/2022 0.76 0.57 - 1.00 mg/dL Final         Passed - eGFR is 30 or above and within 360 days    GFR  calc Af Amer  Date Value Ref Range Status  03/29/2020 96 >59 mL/min/1.73 Final    Comment:    **[facility] currently reports eGFR in compliance with the current**   recommendations of the [employer]. [facility] will   update reporting as new guidelines are published from the NKF-ASN   Task force.    GFR, Estimated  Date Value Ref Range Status  04/28/2021 >60 >60 mL/min Final    Comment:    (NOTE) Calculated using the CKD-EPI Creatinine Equation (2021)    eGFR  Date Value Ref Range Status  12/17/2022 100 >59 mL/min/1.73 Final         Passed - Patient is not pregnant      Passed - Valid encounter within last 12 months    Recent Outpatient Visits           1 year ago Essential (primary) hypertension   [place] [facility], [place], [place]   2 years ago Encounter to establish care   [facility] at [facility], Amy J, NP   2 years ago Essential hypertension   [place] [facility] at [facility], Marzella Schlein, [place]   3 years ago Encounter to establish care   [facility] at [facility], Kandee Keen, MD

## 2023-07-07 MED ORDER — AMLODIPINE BESYLATE 10 MG PO TABS
10.0000 mg | ORAL_TABLET | Freq: Every day | ORAL | 0 refills | Status: AC
Start: 2023-07-07 — End: ?

## 2024-04-19 NOTE — Procedures (Signed)
 Diana Hess
